# Patient Record
Sex: Female | Born: 1975
Health system: Southern US, Community
[De-identification: ages and names within clinical notes are randomized; demographics above are authoritative.]

## PROBLEM LIST (undated history)

## (undated) DIAGNOSIS — N920 Excessive and frequent menstruation with regular cycle: Secondary | ICD-10-CM

## (undated) DIAGNOSIS — G43909 Migraine, unspecified, not intractable, without status migrainosus: Secondary | ICD-10-CM

## (undated) DIAGNOSIS — D649 Anemia, unspecified: Secondary | ICD-10-CM

## (undated) HISTORY — DX: Migraine, unspecified, not intractable, without status migrainosus: G43.909

## (undated) HISTORY — DX: Excessive and frequent menstruation with regular cycle: N92.0

---

## 1998-09-16 HISTORY — PX: WISDOM TOOTH EXTRACTION: SHX21

## 1999-04-06 ENCOUNTER — Other Ambulatory Visit: Admission: RE | Admit: 1999-04-06 | Discharge: 1999-04-06 | Payer: Self-pay | Admitting: Gynecology

## 1999-12-19 ENCOUNTER — Other Ambulatory Visit: Admission: RE | Admit: 1999-12-19 | Discharge: 1999-12-19 | Payer: Self-pay | Admitting: Gynecology

## 2000-01-30 ENCOUNTER — Encounter (INDEPENDENT_AMBULATORY_CARE_PROVIDER_SITE_OTHER): Payer: Self-pay

## 2000-01-30 ENCOUNTER — Other Ambulatory Visit: Admission: RE | Admit: 2000-01-30 | Discharge: 2000-01-30 | Payer: Self-pay | Admitting: Gynecology

## 2000-06-28 ENCOUNTER — Inpatient Hospital Stay (HOSPITAL_COMMUNITY): Admission: AD | Admit: 2000-06-28 | Discharge: 2000-06-30 | Payer: Self-pay | Admitting: *Deleted

## 2000-08-11 ENCOUNTER — Other Ambulatory Visit: Admission: RE | Admit: 2000-08-11 | Discharge: 2000-08-11 | Payer: Self-pay | Admitting: *Deleted

## 2000-11-17 ENCOUNTER — Other Ambulatory Visit: Admission: RE | Admit: 2000-11-17 | Discharge: 2000-11-17 | Payer: Self-pay | Admitting: Gynecology

## 2000-11-18 ENCOUNTER — Encounter (INDEPENDENT_AMBULATORY_CARE_PROVIDER_SITE_OTHER): Payer: Self-pay | Admitting: Specialist

## 2000-11-18 ENCOUNTER — Other Ambulatory Visit: Admission: RE | Admit: 2000-11-18 | Discharge: 2000-11-18 | Payer: Self-pay | Admitting: Gynecology

## 2001-08-04 ENCOUNTER — Other Ambulatory Visit: Admission: RE | Admit: 2001-08-04 | Discharge: 2001-08-04 | Payer: Self-pay | Admitting: Gynecology

## 2002-02-23 ENCOUNTER — Inpatient Hospital Stay (HOSPITAL_COMMUNITY): Admission: AD | Admit: 2002-02-23 | Discharge: 2002-02-25 | Payer: Self-pay | Admitting: Gynecology

## 2002-04-05 ENCOUNTER — Other Ambulatory Visit: Admission: RE | Admit: 2002-04-05 | Discharge: 2002-04-05 | Payer: Self-pay | Admitting: Gynecology

## 2003-03-13 ENCOUNTER — Encounter: Payer: Self-pay | Admitting: Emergency Medicine

## 2003-03-13 ENCOUNTER — Emergency Department (HOSPITAL_COMMUNITY): Admission: EM | Admit: 2003-03-13 | Discharge: 2003-03-13 | Payer: Self-pay | Admitting: Emergency Medicine

## 2003-03-15 ENCOUNTER — Other Ambulatory Visit: Admission: RE | Admit: 2003-03-15 | Discharge: 2003-03-15 | Payer: Self-pay | Admitting: Gynecology

## 2003-10-21 ENCOUNTER — Emergency Department (HOSPITAL_COMMUNITY): Admission: AD | Admit: 2003-10-21 | Discharge: 2003-10-21 | Payer: Self-pay | Admitting: *Deleted

## 2003-11-20 ENCOUNTER — Emergency Department (HOSPITAL_COMMUNITY): Admission: AD | Admit: 2003-11-20 | Discharge: 2003-11-20 | Payer: Self-pay | Admitting: Family Medicine

## 2004-04-24 ENCOUNTER — Other Ambulatory Visit: Admission: RE | Admit: 2004-04-24 | Discharge: 2004-04-24 | Payer: Self-pay | Admitting: Gynecology

## 2004-11-26 ENCOUNTER — Other Ambulatory Visit: Admission: RE | Admit: 2004-11-26 | Discharge: 2004-11-26 | Payer: Self-pay | Admitting: Gynecology

## 2005-05-13 ENCOUNTER — Other Ambulatory Visit: Admission: RE | Admit: 2005-05-13 | Discharge: 2005-05-13 | Payer: Self-pay | Admitting: Gynecology

## 2006-02-05 ENCOUNTER — Emergency Department (HOSPITAL_COMMUNITY): Admission: EM | Admit: 2006-02-05 | Discharge: 2006-02-05 | Payer: Self-pay | Admitting: Emergency Medicine

## 2006-06-16 ENCOUNTER — Other Ambulatory Visit: Admission: RE | Admit: 2006-06-16 | Discharge: 2006-06-16 | Payer: Self-pay | Admitting: Gynecology

## 2006-09-16 HISTORY — PX: TUBAL LIGATION: SHX77

## 2007-09-07 ENCOUNTER — Encounter (INDEPENDENT_AMBULATORY_CARE_PROVIDER_SITE_OTHER): Payer: Self-pay | Admitting: *Deleted

## 2007-09-07 ENCOUNTER — Inpatient Hospital Stay (HOSPITAL_COMMUNITY): Admission: AD | Admit: 2007-09-07 | Discharge: 2007-09-09 | Payer: Self-pay | Admitting: Obstetrics and Gynecology

## 2007-11-16 ENCOUNTER — Emergency Department (HOSPITAL_COMMUNITY): Admission: EM | Admit: 2007-11-16 | Discharge: 2007-11-16 | Payer: Self-pay | Admitting: Family Medicine

## 2008-12-27 ENCOUNTER — Other Ambulatory Visit: Admission: RE | Admit: 2008-12-27 | Discharge: 2008-12-27 | Payer: Self-pay | Admitting: Gynecology

## 2008-12-27 ENCOUNTER — Ambulatory Visit: Payer: Self-pay | Admitting: Gynecology

## 2008-12-27 ENCOUNTER — Encounter: Payer: Self-pay | Admitting: Gynecology

## 2009-01-13 ENCOUNTER — Ambulatory Visit: Payer: Self-pay | Admitting: Gynecology

## 2009-07-04 ENCOUNTER — Ambulatory Visit: Payer: Self-pay | Admitting: Gynecology

## 2009-07-14 ENCOUNTER — Ambulatory Visit: Payer: Self-pay | Admitting: Gynecology

## 2009-07-28 ENCOUNTER — Ambulatory Visit: Payer: Self-pay | Admitting: Gynecology

## 2009-08-14 ENCOUNTER — Ambulatory Visit: Payer: Self-pay | Admitting: Gynecology

## 2009-11-02 ENCOUNTER — Emergency Department (HOSPITAL_COMMUNITY): Admission: EM | Admit: 2009-11-02 | Discharge: 2009-11-02 | Payer: Self-pay | Admitting: Family Medicine

## 2010-01-31 ENCOUNTER — Ambulatory Visit: Payer: Self-pay | Admitting: Gynecology

## 2010-01-31 ENCOUNTER — Other Ambulatory Visit: Admission: RE | Admit: 2010-01-31 | Discharge: 2010-01-31 | Payer: Self-pay | Admitting: Gynecology

## 2010-04-26 ENCOUNTER — Ambulatory Visit: Payer: Self-pay | Admitting: Gynecology

## 2010-05-14 ENCOUNTER — Ambulatory Visit: Payer: Self-pay | Admitting: Gynecology

## 2010-05-17 HISTORY — PX: ENDOMETRIAL ABLATION: SHX621

## 2010-05-23 ENCOUNTER — Ambulatory Visit: Payer: Self-pay | Admitting: Gynecology

## 2010-06-06 ENCOUNTER — Ambulatory Visit: Payer: Self-pay | Admitting: Gynecology

## 2010-06-07 ENCOUNTER — Ambulatory Visit: Payer: Self-pay | Admitting: Gynecology

## 2010-06-12 ENCOUNTER — Ambulatory Visit: Payer: Self-pay | Admitting: Gynecology

## 2010-06-25 ENCOUNTER — Ambulatory Visit: Payer: Self-pay | Admitting: Gynecology

## 2010-08-03 ENCOUNTER — Ambulatory Visit: Payer: Self-pay | Admitting: Gynecology

## 2010-09-20 ENCOUNTER — Ambulatory Visit
Admission: RE | Admit: 2010-09-20 | Discharge: 2010-09-20 | Payer: Self-pay | Source: Home / Self Care | Attending: Gynecology | Admitting: Gynecology

## 2010-10-02 ENCOUNTER — Ambulatory Visit
Admission: RE | Admit: 2010-10-02 | Discharge: 2010-10-02 | Payer: Self-pay | Source: Home / Self Care | Attending: Gynecology | Admitting: Gynecology

## 2011-01-29 NOTE — Op Note (Signed)
NAMEANGELLI, Latoya Murphy   ACCOUNT NO.:  [id number]   MEDICAL RECORD NO.:  [id number]          PATIENT TYPE:  INP   LOCATION:  9111                          FACILITY:  [facility]   PHYSICIAN:  Gerri Spore B. Earlene Plater, M.D.  DATE OF BIRTH:  1976-04-30   DATE OF PROCEDURE:  09/07/2007  DATE OF DISCHARGE:                               OPERATIVE REPORT   PREOPERATIVE DIAGNOSIS:  Desires tubal sterilization.   POSTOPERATIVE DIAGNOSIS:  Desires tubal sterilization.   PROCEDURE:  Postpartum tubal ligation.   SURGEON:  Chester Holstein. Earlene Plater, M.D.   ASSISTANT:  None.   ANESTHESIA:  Epidural, and 5 mL of 1% Nesacaine local.   SPECIMENS:  Portion of left and right tubes submitted to pathology.   BLOOD LOSS:  Minimal.   COMPLICATIONS:  None.   INDICATIONS:  The patient desires permanent tubal sterilization.  Alternatives such as OCPs, IUD, as well as vasectomy discussed.  Failure  rate, ectopic risks discussed.  Risks of surgery discussed, including  infection, bleeding, and damage to surrounding organs.   PROCEDURE:  The patient was taken to the operating room, with epidural  anesthesia in place.  Prepped and draped in standard fashion.  Foley  catheter was in the bladder, as the patient had some urinary retention  postpartum.  The abdomen was palpated.  The fundus was about 2 cm above  the umbilicus.  Therefore, approximately 2 cm above the umbilicus the  skin was infiltrated with 5 mL of 1% Nesacaine.  A transverse incision  was made.  Fascia divided sharply.  Trendelenburg position was obtained.  Bowel packed superiorly with a moistened tape.  Right tube identified,  followed to its fimbriated end.  A 2 cm segment was isolated just 2 cm  distal to the cornu, doubly tied, and excised.  Hemostasis with Bovie  cautery.  Repeated on the left side in the same manner.  Each tubal site  was reinspected and was hemostatic.  The pack was removed and the fascia  closed with a running stitch of 0  Vicryl.  The skin was closed with 4-0  Vicryl.   The patient tolerated the procedure well, with no complications.  She  was taken to the recovery room awake, alert, and in stable condition.  All counts were correct per the operating room staff.      Gerri Spore B. Earlene Plater, M.D.  Electronically Signed     WBD/MEDQ  D:  09/07/2007  T:  09/08/2007  Job:  [id number]

## 2011-02-01 NOTE — Discharge Summary (Signed)
[facility]  Patient:    Latoya Murphy, Latoya Murphy Visit Number: [id number] MRN: [id number]          Service Type: OBS Location: 910A 9112 01 Attending Physician:  Douglass Rivers Dictated by:   Antony Contras, [facility] Admit Date:  02/23/2002 Discharge Date: 02/25/2002                             Discharge Summary  DISCHARGE DIAGNOSES:          1. Intrauterine pregnancy at term.                               2. Spontaneous onset of labor.  PROCEDURE:                    Normal spontaneous vaginal delivery of a                               viable infant over intact perineum with repair                               of second degree laceration.  HISTORY OF PRESENT ILLNESS:   The patient is a 35 year old gravida 2 para 1-0-0-1, with an LMP of May 12, 2001, [facility] February 16, 2002.  Prenatal course complicated by positive sickle cell trait, also anemia.  PRENATAL LABORATORY DATA:     Blood type O positive, antibody screen negative. RPR, HBsAG, HIV nonreactive.  GBS was positive.  HOSPITAL COURSE:              The patient was admitted on March 03, 2002, with spontaneous onset of labor.  She was given GBS prophylaxis.  She progressed to complete dilatation.  She did sustain some variable decelerations in the 80s after AROM, which were relieved with knee-chest position and oxygen.  Descent did progress rapidly.  She did deliver spontaneously an Apgar 9/9 female infant weighing 7 pounds 7 ounces over an intact perineum with repair of second degree laceration.  Postpartum course:  The patient remained afebrile, had no difficulty voiding, and was ready for discharge in satisfactory condition on her second postpartum day.  CBC:  Hematocrit 28.1, hemoglobin 9.7, WBC 14.9, platelets 207.  DISPOSITION:                  Follow up in six weeks.  Continue prenatal vitamins and iron, Motrin for pain. Dictated by:   Antony Contras, [facility] Attending Physician:  Douglass Rivers DD:  03/22/02 TD:  03/24/02 Job: [id number]

## 2011-02-01 NOTE — Discharge Summary (Signed)
[facility]  Patient:    Latoya Murphy, Latoya Murphy                       MRN: [id number] Adm. Date:  06/28/00 Disc. Date: 06/30/00 Attending:  Katy Fitch, M.D. 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  PROCEDURE:                    Forceps assisted vaginal delivery with second degree midline episiotomy, delivery of viable infant.  HISTORY OF PRESENT ILLNESS:   Patient is a 35 year old prima gravida with LMP of September 21, 1999.  [facility] June 27, 2000.  Prenatal risk factors include positive sickle cell trait, also history of LGSIL CIN 1 Pap which was confirmed with C&B and will be treated postpartum.  LABORATORIES:                 Blood type O+.  Antibody screen negative. Sickle cell positive.  RPR, HBSAG, HIV nonreactive.  Rubella immune.  Patient declined MSAFP.  GBS was negative.  HOSPITAL COURSE:              Patient was admitted on June 28, 2000 at 39 weeks and 6 days with two to three minute contractions x 2 hours.  Cervical dilatation was 8-9 cm, 80%, vertex at a 0 station.  Patient did develop temperature elevation during labor also with some fetal tachycardia but delivery was accomplished with Simpson forceps over a second degree midline episiotomy and patient was delivered of a Apgar 8 and 9 female infant weighing 7 pounds 12 ounces.  Postpartum course was uncomplicated.  Patient remained afebrile.  Had no difficulty voiding.  CBC:  Hematocrit 26.5, hemoglobin 9.2, WBC 15.1, platelets 182.  Patient was able to be discharged on her second postpartum day in satisfactory condition.  DISPOSITION:                  Follow up in six weeks.  Continue prenatal vitamins, iron, Motrin, Tylox for pain.DD:   07/22/00 TD:  07/22/00 Job: [id number]

## 2011-02-06 ENCOUNTER — Other Ambulatory Visit (HOSPITAL_COMMUNITY)
Admission: RE | Admit: 2011-02-06 | Discharge: 2011-02-06 | Disposition: A | Discharge status: Home / Self Care | Payer: 59 | Source: Ambulatory Visit | Attending: Gynecology | Admitting: Gynecology

## 2011-02-06 ENCOUNTER — Encounter (INDEPENDENT_AMBULATORY_CARE_PROVIDER_SITE_OTHER): Payer: 59 | Admitting: Gynecology

## 2011-02-06 ENCOUNTER — Other Ambulatory Visit: Payer: Self-pay | Admitting: Gynecology

## 2011-02-06 DIAGNOSIS — N925 Other specified irregular menstruation: Secondary | ICD-10-CM

## 2011-02-06 DIAGNOSIS — Z01419 Encounter for gynecological examination (general) (routine) without abnormal findings: Secondary | ICD-10-CM

## 2011-02-06 DIAGNOSIS — Z13220 Encounter for screening for lipoid disorders: Secondary | ICD-10-CM

## 2011-02-06 DIAGNOSIS — R635 Abnormal weight gain: Secondary | ICD-10-CM

## 2011-02-06 DIAGNOSIS — Z124 Encounter for screening for malignant neoplasm of cervix: Secondary | ICD-10-CM | POA: Insufficient documentation

## 2011-02-06 DIAGNOSIS — N949 Unspecified condition associated with female genital organs and menstrual cycle: Secondary | ICD-10-CM

## 2011-02-06 DIAGNOSIS — Z833 Family history of diabetes mellitus: Secondary | ICD-10-CM

## 2011-03-08 ENCOUNTER — Institutional Professional Consult (permissible substitution) (INDEPENDENT_AMBULATORY_CARE_PROVIDER_SITE_OTHER): Payer: 59 | Admitting: Gynecology

## 2011-03-08 DIAGNOSIS — N920 Excessive and frequent menstruation with regular cycle: Secondary | ICD-10-CM

## 2011-04-15 ENCOUNTER — Telehealth: Payer: Self-pay | Admitting: *Deleted

## 2011-04-15 NOTE — Telephone Encounter (Signed)
(  SEE CHART NOTE 03/08/11)PT CALLING WANTING TO KNOW HOW MAY DAYS SHE SHOULD TAKE HER LYSTEADA 650MG . PLEASE ADVISE.

## 2011-04-15 NOTE — Telephone Encounter (Signed)
Latoya Murphy, placed outpatient that the Lysteda should be taken only during her menstruation. She should take 2 tablets 3 times a day not to exceed 5 days for a total of 30 tablets during each cycle.

## 2011-04-16 NOTE — Telephone Encounter (Signed)
LM FOR PT TO CALL RE:BELOW NOTE. 

## 2011-04-16 NOTE — Telephone Encounter (Signed)
PT INFORMED WITH THE BELOW NOTE. 

## 2011-04-24 ENCOUNTER — Telehealth: Payer: Self-pay | Admitting: *Deleted

## 2011-04-24 MED ORDER — MEGESTROL ACETATE 40 MG PO TABS: 40.0000 mg | ORAL_TABLET | Freq: Two times a day (BID) | ORAL | Status: AC

## 2011-04-24 NOTE — Telephone Encounter (Signed)
PT INFORMED WITH THE BELOW NOTE. PLEASE PLACE MEGACE RX UNDER MEDS &ORDERS.

## 2011-04-24 NOTE — Telephone Encounter (Signed)
PT C/O BLEEDING LMP:03/14/11 PT HAS TAKEN THE COMPLETE DOSE OF LYSTEDA PRESCRIBED.PT STATES THE BLEEDING IS NOW HEAVY. PT WANTS TO KNOW WHAT SHOULD BE DONE NEXT? PER OFFICE VISIT ON  03/08/11 PT STATES SURGERY WOULD BE NEXT STEP. PLEASE ADVISE.

## 2011-04-24 NOTE — Telephone Encounter (Signed)
The patient with persistent dysfunctional uterine bleeding. Will be recommended that she come to the office for further evaluation and consider other options. She has tried the [place] with no avail. She has also used the Mirena IUD in multiple oral contraceptive pills in the past. She is even tried Depo-Provera as well and has had endometrial ablation in 2011. We'll need to discuss definitive surgery such as a hysterectomy at the office visit. We'll call her in Megace 40 mg to take 1 twice a day for the next 10 days. Of note she's had a tubal sterilization procedure in the past.

## 2011-04-24 NOTE — Telephone Encounter (Signed)
Addended by: Ok Edwards on: 04/24/2011 02:16 PM   Modules accepted: Orders

## 2011-04-24 NOTE — Telephone Encounter (Signed)
PT INFORMED WITH THE BELOW NOTE. 

## 2011-05-01 ENCOUNTER — Telehealth: Payer: Self-pay

## 2011-05-01 ENCOUNTER — Ambulatory Visit (INDEPENDENT_AMBULATORY_CARE_PROVIDER_SITE_OTHER): Payer: 59 | Admitting: Gynecology

## 2011-05-01 ENCOUNTER — Encounter: Payer: Self-pay | Admitting: Gynecology

## 2011-05-01 VITALS — BP 104/70

## 2011-05-01 DIAGNOSIS — N920 Excessive and frequent menstruation with regular cycle: Secondary | ICD-10-CM

## 2011-05-01 DIAGNOSIS — N921 Excessive and frequent menstruation with irregular cycle: Secondary | ICD-10-CM | POA: Insufficient documentation

## 2011-05-01 NOTE — Progress Notes (Signed)
Patient is a 35 year old gravida 3 para 3 who presented to the office today for further discussion of her ongoing menorrhagia. Review of her records indicated due to her dysfunctional uterine bleeding she has used several options. She's had a previous tubal sterilization procedure. She's had a an endometrial ablation done in 2011. She has tried Depo-Provera. She has tried the [place] IUD which has fallen out by itself and not effective. She's tried numerous oral contraceptive pills as well with no success in regulating her cycles. She was seen in the office for her annual exam in May of this year she had a normal TSH normal prolactin normal blood sugar normal screening cholesterol and her CBC and platelet count were normal as well. She has had 3 prior vaginal deliveries. She denies any family history of any bleeding disorder.  Pelvic exam: Bartholin urethra Skene glands: Within normal limits Vagina: No lesions or discharge Cervix: No lesions or discharge Uterus anteverted normal size shape and consistency no palpable adnexal masses or tenderness Rectal exam: Deferred  Assessment: Refractory metromenorrhagia patient like to proceed with definitive treatment I have recommended a transvaginal hysterectomy with ovarian conservation. Patient had a normal ultrasound on September 2011. Literature information was provided on hysterectomy the risks benefits and pros and cons were discussed and we'll schedule for the month of September as per request. All questions are answered we'll follow accordingly. Of note, will check PT PTT and fibrinogen today.

## 2011-05-01 NOTE — Telephone Encounter (Signed)
I left msg for pt to call me to schedule her surgery. Dr. Glenetta Hew indicated she would like week of Sept 24 and I have some block time on Th Sept 27. I asked her to call and let me know how that looks for her and we will schedule.

## 2011-05-02 LAB — PROTIME-INR: Prothrombin Time: 14.7 seconds (ref 11.6–15.2)

## 2011-05-02 LAB — APTT: aPTT: 32 seconds (ref 24–37)

## 2011-05-06 ENCOUNTER — Telehealth: Payer: Self-pay

## 2011-05-06 NOTE — Telephone Encounter (Signed)
I called patient again today and was able to reach her.  We scheduled her surgery for Thursday, Sept 27 7:30am at [facility] as she had requested week of Sept 24th.  She will come to [facility] for labs in the week before surgery and she was instructed.  Per Dr. Glenetta Hew no consult necessary.

## 2011-05-07 ENCOUNTER — Other Ambulatory Visit: Payer: Self-pay | Admitting: Gynecology

## 2011-05-07 DIAGNOSIS — Z01818 Encounter for other preprocedural examination: Secondary | ICD-10-CM

## 2011-05-10 ENCOUNTER — Telehealth: Payer: Self-pay

## 2011-05-10 NOTE — Telephone Encounter (Signed)
I informed patient that I got her FMLA forms but there was not a signed records release form with them.  I offered to fax one but she does not have access to fax.  I will mail it to her and she can sign and send it back.  I put it in the mail this morning.  KA

## 2011-05-16 ENCOUNTER — Telehealth: Payer: Self-pay

## 2011-05-16 NOTE — Telephone Encounter (Addendum)
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  They are starting training classes.  Patient asked Human Resources if it would affect her leave if she took classes while she was out on disability/recovery for surgery. They told her it would not affect her disability but she would need a note from Dr. Glenetta Hew saying okay for her to do. I told patient I thought you would probably want her to wait at least two weeks before she does anything like that. I reminded her there will be a period where she cannot even drive.  I told her I would check with you regarding if you thought classes okay and to advise me if okay how many hours a week she can spend in classes and at what point in her recovery??

## 2011-05-16 NOTE — Telephone Encounter (Signed)
Patient called earlier to see if Dr. Glenetta Hew thought it would be okay for her to take EPIC classes during her recovery period after surgery. Dr. Glenetta Hew said this would be fine beginning two weeks after surgery.  I offered patient a note for employer but at this point she is not sure if she needs one and who it would go to. She will call me back if she needs a letter and I will prepare one for her.

## 2011-06-10 ENCOUNTER — Other Ambulatory Visit (INDEPENDENT_AMBULATORY_CARE_PROVIDER_SITE_OTHER): Payer: 59 | Admitting: *Deleted

## 2011-06-10 DIAGNOSIS — Z01818 Encounter for other preprocedural examination: Secondary | ICD-10-CM

## 2011-06-10 LAB — CULTURE, ROUTINE-ABSCESS

## 2011-06-11 ENCOUNTER — Telehealth: Payer: Self-pay

## 2011-06-11 ENCOUNTER — Other Ambulatory Visit: Payer: 59

## 2011-06-11 ENCOUNTER — Other Ambulatory Visit: Payer: Self-pay | Admitting: Gynecology

## 2011-06-11 DIAGNOSIS — Z01818 Encounter for other preprocedural examination: Secondary | ICD-10-CM

## 2011-06-11 DIAGNOSIS — D500 Iron deficiency anemia secondary to blood loss (chronic): Secondary | ICD-10-CM

## 2011-06-11 NOTE — Telephone Encounter (Signed)
Per Dr. Glenetta Hew I contacted patient around 5:00pm yesterday (Sept 24) and let her know that Dr. Glenetta Hew wanted to cancel her surgery for 9/27 due to her labs being abnormal (hgb, hct and platelets low).  Dr. Glenetta Hew wants patient to have Lupron 11.25 mg and wait a couple of months to have surgery. Pt understands.  Patient concerned about her $38 prepymt she made today and getting it back ASAP. I contacted PH but since after 5:00pm all the batches have been closed and too late to return it today.  PH will make request to the hospital billing depts.

## 2011-06-11 NOTE — Telephone Encounter (Signed)
Patient wanted to clarify the name of injection Dr. Glenetta Hew recommended. I told her it was Lupron and that it would suppress the hormones and keep her endometrium from building up and also, suppress her bleeding and hopefully allow her hct., hcb and platelets to recover. We have rescheduled her surgery for 08/15/11.    Dr. JF-Patient is only taking one over the counter iron tablet daily and has been doing that all along. I told her to go ahead and take twice daily iron and hopefully with the suppression and the extra iron she will be ready for surgery end of Nov. Sound OK?????

## 2011-06-12 NOTE — Telephone Encounter (Signed)
Dr. Lily Peer staff messaged me and said the info I gave the patient was correct.

## 2011-06-13 ENCOUNTER — Other Ambulatory Visit: Payer: Self-pay | Admitting: *Deleted

## 2011-06-13 ENCOUNTER — Telehealth: Payer: Self-pay | Admitting: *Deleted

## 2011-06-13 DIAGNOSIS — N920 Excessive and frequent menstruation with regular cycle: Secondary | ICD-10-CM

## 2011-06-13 MED ORDER — LEUPROLIDE ACETATE (3 MONTH) 11.25 MG IM KIT: 11.2500 mg | PACK | INTRAMUSCULAR | Status: DC

## 2011-06-13 MED ORDER — LEUPROLIDE ACETATE (3 MONTH) 11.25 MG IM KIT
11.2500 mg | PACK | Freq: Once | INTRAMUSCULAR | Status: AC
  Administered 2011-06-14: 11.25 mg via INTRAMUSCULAR

## 2011-06-13 NOTE — Telephone Encounter (Signed)
Message copied by Mckinley Jewel Darlyne Schmiesing L on Thu Jun 13, 2011  3:00 PM ------      Message from: Keenan Bachelor      Created: Mon Jun 10, 2011  5:14 PM       Dr. Glenetta Hew had me cancel her surgery for this coming Thursday because her hgb was 9 hct 20 and platelets low.  He wants her to have LUPRON 11.25 asap and reschedule her surgery in two months. We are planning on rescheduling it at the end of November.  I told her you would check it out and let her know. Thanks!!!

## 2011-06-13 NOTE — Telephone Encounter (Signed)
Patient informed Lupron [place] 11.25 called into [facility].  She will call when injection is ready to be picked up to schedule time to come in for injection.

## 2011-06-14 ENCOUNTER — Ambulatory Visit: Payer: 59 | Admitting: Anesthesiology

## 2011-06-14 DIAGNOSIS — N920 Excessive and frequent menstruation with regular cycle: Secondary | ICD-10-CM

## 2011-06-21 LAB — CBC
HCT: 28.2 — ABNORMAL LOW
Hemoglobin: 11 — ABNORMAL LOW
Hemoglobin: 9.7 — ABNORMAL LOW
Platelets: 200
RDW: 14.6
RDW: 14.9
WBC: 11.2 — ABNORMAL HIGH

## 2011-06-21 LAB — RPR: RPR Ser Ql: NONREACTIVE

## 2011-06-27 ENCOUNTER — Telehealth: Payer: Self-pay

## 2011-06-27 NOTE — Telephone Encounter (Signed)
Previously patient's surgery had to be cancelled right before due to low blood counts. It has been rescheduled to Nov 29th.  Patient knows that you plan to recheck her labs the week before surgery but she asked if in addition to that she might be able to check it one time before. She said she would like to know if things have improved so she can be prepared if not.  She asked if she could have labs checked at end of October just to give her an idea how things are looking.  (If so, what to order?)

## 2011-06-27 NOTE — Telephone Encounter (Signed)
I would recommend patient come to the office in a month for CBC to see if the combination of the Lupron and the R. Supplementation have improved her hemoglobin so we can proceed with her planned transvaginal hysterectomy in the month of November

## 2011-06-27 NOTE — Telephone Encounter (Signed)
Patient advised of Dr. Manuela Schwartz recommendation below. She will come the 2nd week in November. ka

## 2011-07-01 ENCOUNTER — Telehealth: Payer: Self-pay

## 2011-07-01 NOTE — Telephone Encounter (Signed)
Patient called and asked if I would sent a letter stating that her surgery had been rescheduled and giving them the new date to her HR Mgr at [employer] [employer].  She said this will keep [place] from having to re-do the FMLA forms I had done for her Sept surgery that was rescheduled. Letter faxed to HR/attn: Wyman Songster.  ka

## 2011-07-25 ENCOUNTER — Other Ambulatory Visit (INDEPENDENT_AMBULATORY_CARE_PROVIDER_SITE_OTHER): Payer: 59

## 2011-07-25 ENCOUNTER — Other Ambulatory Visit: Payer: 59

## 2011-07-25 DIAGNOSIS — D500 Iron deficiency anemia secondary to blood loss (chronic): Secondary | ICD-10-CM

## 2011-08-01 ENCOUNTER — Other Ambulatory Visit: Payer: 59

## 2011-08-12 ENCOUNTER — Telehealth: Payer: Self-pay

## 2011-08-12 NOTE — Telephone Encounter (Signed)
Dr. Glenetta Hew said it was fine for patient to have preop labs tomorrow.  Pt advised.

## 2011-08-12 NOTE — Telephone Encounter (Signed)
Closed encounter in error. Need to route to MD.

## 2011-08-12 NOTE — Telephone Encounter (Signed)
Patient called to see if she still needed to come this week for preop labwork. (She needs to make her $400 surgery prepayment as well.) I told her that she did need to come in tomorrow and have her preop labwork done as planned. She said that when Victorino Dike called her about her lab results from two weeks ago that she told her that Dr. Glenetta Hew wanted CBC checked on morning of surgery . Is it okay for labs to be done tomorrow or did you want to wait and have CBC morning of surgery (Thursday). Pt. Is planning on coming here in the morning. (The patients that were set up for preop labs in the [facility] system before the change I left instead of confusing them with changing the plan.) Patient's paper chart should be in your surgery box.

## 2011-08-12 NOTE — Telephone Encounter (Signed)
Patient called to see if she still needed to come this week for preop labwork. (She needs to make her $400 surgery prepayment as well.) I told her that she did need to come in tomorrow and have her preop labwork done as planned. She said that when Jennifer called her about her lab results from two weeks ago that she told her that Dr. JF wanted CBC checked on morning of surgery . Is it okay for labs to be done tomorrow or did you want to wait and have CBC morning of surgery (Thursday). Pt. Is planning on coming here in the morning. (The patients that were set up for preop labs in the [facility] system before the change I left instead of confusing them with changing the plan.) Patient's paper chart should be in your surgery box.   

## 2011-08-13 ENCOUNTER — Encounter (HOSPITAL_BASED_OUTPATIENT_CLINIC_OR_DEPARTMENT_OTHER): Payer: Self-pay | Admitting: *Deleted

## 2011-08-13 ENCOUNTER — Other Ambulatory Visit (INDEPENDENT_AMBULATORY_CARE_PROVIDER_SITE_OTHER): Payer: 59

## 2011-08-13 ENCOUNTER — Telehealth: Payer: Self-pay

## 2011-08-13 DIAGNOSIS — Z01818 Encounter for other preprocedural examination: Secondary | ICD-10-CM

## 2011-08-13 NOTE — Telephone Encounter (Signed)
Per Dr. Glenetta Hew he did want to change the medication order and he has already done that in his orders set. I called Denise at [facility] and told her to cancel the Cefotan and to use the new antibiotic order that Dr. Glenetta Hew added to orders set and to call if any questions. ka

## 2011-08-13 NOTE — Telephone Encounter (Signed)
This was a patient that you had done paper orders for and they entered them into computer for you.  You had asked about her operative permit because you could not see it in orders set. It has been put in as it was on the written order sheet inside chart.  Secondly, Angelique Blonder says the patient reports as Penicillin allergy and you have order Cefotan 1 Gm. And that is contraindicated. She wondered if you wanted to change the antibiotic order. 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  Thanks, KA

## 2011-08-13 NOTE — Progress Notes (Addendum)
NPO AFTER MN. PT ARRIVES AT 0615. LABS DONE AT OFFICE, RESULTS IN EPIC. REVIEWED [facility] GUIDELINES.  CALLED LM W/ KATHY AT DR Lily Peer OFFICE, UNABLE TO PUT IN ORDER FOR ANTIBIOTIC IVPT ALLERGY TO PCN, WAITING FOR CALL BACK.

## 2011-08-14 ENCOUNTER — Encounter: Payer: Self-pay | Admitting: Gynecology

## 2011-08-14 ENCOUNTER — Ambulatory Visit (INDEPENDENT_AMBULATORY_CARE_PROVIDER_SITE_OTHER): Payer: 59 | Admitting: Gynecology

## 2011-08-14 VITALS — BP 128/90

## 2011-08-14 DIAGNOSIS — D649 Anemia, unspecified: Secondary | ICD-10-CM | POA: Insufficient documentation

## 2011-08-14 DIAGNOSIS — N920 Excessive and frequent menstruation with regular cycle: Secondary | ICD-10-CM

## 2011-08-14 DIAGNOSIS — N946 Dysmenorrhea, unspecified: Secondary | ICD-10-CM

## 2011-08-14 NOTE — Progress Notes (Signed)
Latoya Murphy 1976/05/14 [id number]   History:    35 y.o.  gravida 3 para 3 who presented to the office today for her preoperative consultation. Patient scheduled for transvaginal hysterectomy Thursday, November 29 as a result of her refractory metromenorrhagia and anemia. Patient had received Lupron 11.25 mg in September. Her surgery previously canceled due to the fact her hemoglobin was 9.6 her most recent hemoglobin on November 27 was 11.4 with hematocrit 37.3 and platelet count 258,000. Patient has had endometrial ablation in the past had tried Depo-Provera had tried oral contraceptive pills and even IUD and her bleeding continues to be an issue. She had normal recent PT PTT and fibrinogen.  Past medical history,surgical history, family history and social history were all reviewed and documented in the EPIC chart.  Gynecologic History Patient's last menstrual period was 06/09/2011. Contraception: tubal ligation Last Pap: 2012. Results were: normal Last mammogram: None. Results were: Not applicable  Obstetric History OB History    Grav Para Term Preterm Abortions TAB SAB Ect Mult Living                   ROS:  Was performed and pertinent positives and negatives are included in the history.  Exam: chaperone present  BP 128/90  LMP 06/09/2011  There is no height or weight on file to calculate BMI.  General appearance : Well developed well nourished female. No acute distress HEENT: Neck supple, trachea midline, no carotid bruits, no thyroidmegaly Lungs: Clear to auscultation, no rhonchi or wheezes, or rib retractions  Heart: Regular rate and rhythm, no murmurs or gallops Breast:Examined in sitting and supine position were symmetrical in appearance, no palpable masses or tenderness,  no skin retraction, no nipple inversion, no nipple discharge, no skin discoloration, no axillary or supraclavicular lymphadenopathy Abdomen: no palpable masses or tenderness, no rebound or  guarding Extremities: no edema or skin discoloration or tenderness  Pelvic:  Bartholin, Urethra, Skene Glands: Within normal limits             Vagina: No gross lesions or discharge  Cervix: No gross lesions or discharge  Uterus  anteverted, normal size, shape and consistency, non-tender and mobile  Adnexa  Without masses or tenderness  Anus and perineum  normal   Rectovaginal  normal sphincter tone without palpated masses or tenderness             Hemoccult not done     Assessment/Plan:  35 y.o. gravida 3 para 3 with refractory menorrhagia contributing to anemia. Patient has failed regulation of her cycles with oral contraceptive pills, Depo-Provera, endometrial ablation, and IUD. Patient's anemia was corrected by postponing her surgery in place her on Lupron 11.25 mg for 3 month course and initiating iron supplementation twice a day. The risks benefits and pros and cons of the operation the been discussed in detail and literature information been provided. Risk of the operation are as follows: The risk of infection (and she'll receive prophylaxis antibiotic), the risk of DVT (she'll have PAS stockings), the risk of hemorrhage (blood or blood products has a risk of anaphylactic reaction hepatitis or AIDS), the risk of trauma to a nearby structures such bladder and or colon. All these issues were discussed with the patient in detail all questions were answered and a follow accordingly.    Ok Edwards MD, 11:04 AM 08/14/2011

## 2011-08-15 ENCOUNTER — Encounter (HOSPITAL_BASED_OUTPATIENT_CLINIC_OR_DEPARTMENT_OTHER): Payer: Self-pay | Admitting: Anesthesiology

## 2011-08-15 ENCOUNTER — Encounter (HOSPITAL_BASED_OUTPATIENT_CLINIC_OR_DEPARTMENT_OTHER): Payer: Self-pay | Admitting: *Deleted

## 2011-08-15 ENCOUNTER — Ambulatory Visit (HOSPITAL_BASED_OUTPATIENT_CLINIC_OR_DEPARTMENT_OTHER)
Admission: RE | Admit: 2011-08-15 | Discharge: 2011-08-16 | Disposition: A | Discharge status: Home / Self Care | Payer: 59 | Source: Ambulatory Visit | Attending: Gynecology | Admitting: Gynecology

## 2011-08-15 ENCOUNTER — Encounter (HOSPITAL_BASED_OUTPATIENT_CLINIC_OR_DEPARTMENT_OTHER)
Admission: RE | Disposition: A | Discharge status: Home / Self Care | Payer: Self-pay | Source: Ambulatory Visit | Attending: Gynecology

## 2011-08-15 ENCOUNTER — Ambulatory Visit (HOSPITAL_BASED_OUTPATIENT_CLINIC_OR_DEPARTMENT_OTHER): Payer: 59 | Admitting: Anesthesiology

## 2011-08-15 ENCOUNTER — Other Ambulatory Visit: Payer: Self-pay | Admitting: Gynecology

## 2011-08-15 DIAGNOSIS — D649 Anemia, unspecified: Secondary | ICD-10-CM

## 2011-08-15 DIAGNOSIS — Z9889 Other specified postprocedural states: Secondary | ICD-10-CM

## 2011-08-15 DIAGNOSIS — N920 Excessive and frequent menstruation with regular cycle: Secondary | ICD-10-CM | POA: Insufficient documentation

## 2011-08-15 DIAGNOSIS — N946 Dysmenorrhea, unspecified: Secondary | ICD-10-CM

## 2011-08-15 HISTORY — PX: VAGINAL HYSTERECTOMY: SHX2639

## 2011-08-15 HISTORY — DX: Anemia, unspecified: D64.9

## 2011-08-15 SURGERY — HYSTERECTOMY, VAGINAL
Anesthesia: General | Site: Vagina | Wound class: Clean Contaminated

## 2011-08-15 MED ORDER — FENTANYL CITRATE 0.05 MG/ML IJ SOLN
25.0000 ug | INTRAMUSCULAR | Status: DC | PRN
  Administered 2011-08-15 (×2): 25 ug via INTRAVENOUS

## 2011-08-15 MED ORDER — CLINDAMYCIN PHOSPHATE 900 MG/50ML IV SOLN: 900.0000 mg | INTRAVENOUS | Status: AC

## 2011-08-15 MED ORDER — LACTATED RINGERS IV SOLN: INTRAVENOUS | Status: DC

## 2011-08-15 MED ORDER — SODIUM CHLORIDE 0.9 % IR SOLN
Status: DC | PRN
  Administered 2011-08-15: 500 mL

## 2011-08-15 MED ORDER — LACTATED RINGERS IV SOLN
INTRAVENOUS | Status: DC
  Administered 2011-08-15: 14:00:00 via INTRAVENOUS

## 2011-08-15 MED ORDER — CIPROFLOXACIN IN D5W 400 MG/200ML IV SOLN
INTRAVENOUS | Status: DC | PRN
  Administered 2011-08-15: 400 mg via INTRAVENOUS

## 2011-08-15 MED ORDER — DIPHENHYDRAMINE HCL 12.5 MG/5ML PO ELIX: 12.5000 mg | ORAL_SOLUTION | Freq: Four times a day (QID) | ORAL | Status: DC | PRN

## 2011-08-15 MED ORDER — PROPOFOL 10 MG/ML IV EMUL
INTRAVENOUS | Status: DC | PRN
  Administered 2011-08-15: 200 mg via INTRAVENOUS

## 2011-08-15 MED ORDER — SODIUM CHLORIDE 0.9 % IJ SOLN: 9.0000 mL | INTRAMUSCULAR | Status: DC | PRN

## 2011-08-15 MED ORDER — LACTATED RINGERS IV SOLN
INTRAVENOUS | Status: DC | PRN
  Administered 2011-08-15 (×2): via INTRAVENOUS

## 2011-08-15 MED ORDER — ONDANSETRON HCL 4 MG/2ML IJ SOLN: 4.0000 mg | Freq: Four times a day (QID) | INTRAMUSCULAR | Status: DC | PRN

## 2011-08-15 MED ORDER — SILVER NITRATE-POT NITRATE 75-25 % EX MISC
CUTANEOUS | Status: DC | PRN
  Administered 2011-08-15: 1

## 2011-08-15 MED ORDER — MIDAZOLAM HCL 5 MG/5ML IJ SOLN
INTRAMUSCULAR | Status: DC | PRN
  Administered 2011-08-15: 2 mg via INTRAVENOUS

## 2011-08-15 MED ORDER — NALOXONE HCL 0.4 MG/ML IJ SOLN: 0.4000 mg | INTRAMUSCULAR | Status: DC | PRN

## 2011-08-15 MED ORDER — PROMETHAZINE HCL 25 MG/ML IJ SOLN
6.2500 mg | INTRAMUSCULAR | Status: DC | PRN
  Administered 2011-08-15: 12.5 mg via INTRAVENOUS

## 2011-08-15 MED ORDER — LIDOCAINE HCL (CARDIAC) 20 MG/ML IV SOLN
INTRAVENOUS | Status: DC | PRN
  Administered 2011-08-15: 100 mg via INTRAVENOUS

## 2011-08-15 MED ORDER — DIPHENHYDRAMINE HCL 50 MG/ML IJ SOLN: 12.5000 mg | Freq: Four times a day (QID) | INTRAMUSCULAR | Status: DC | PRN

## 2011-08-15 MED ORDER — DIPHENHYDRAMINE HCL 50 MG/ML IJ SOLN
12.5000 mg | Freq: Four times a day (QID) | INTRAMUSCULAR | Status: DC | PRN
  Administered 2011-08-15: 12.5 mg via INTRAVENOUS

## 2011-08-15 MED ORDER — LIDOCAINE-EPINEPHRINE 1 %-1:100000 IJ SOLN
INTRAMUSCULAR | Status: DC | PRN
  Administered 2011-08-15: 20 mL

## 2011-08-15 MED ORDER — FENTANYL CITRATE 0.05 MG/ML IJ SOLN
INTRAMUSCULAR | Status: DC | PRN
  Administered 2011-08-15 (×4): 50 ug via INTRAVENOUS

## 2011-08-15 MED ORDER — CLINDAMYCIN PHOSPHATE 600 MG/50ML IV SOLN
INTRAVENOUS | Status: DC | PRN
  Administered 2011-08-15: 900 mg via INTRAVENOUS

## 2011-08-15 MED ORDER — KETOROLAC TROMETHAMINE 30 MG/ML IJ SOLN
INTRAMUSCULAR | Status: DC | PRN
  Administered 2011-08-15: 30 mg via INTRAVENOUS

## 2011-08-15 MED ORDER — DEXAMETHASONE SODIUM PHOSPHATE 4 MG/ML IJ SOLN
INTRAMUSCULAR | Status: DC | PRN
  Administered 2011-08-15: 10 mg via INTRAVENOUS

## 2011-08-15 MED ORDER — ESTRADIOL 0.1 MG/GM VA CREA
TOPICAL_CREAM | VAGINAL | Status: DC | PRN
  Administered 2011-08-15: 1 via VAGINAL

## 2011-08-15 MED ORDER — CIPROFLOXACIN IN D5W 400 MG/200ML IV SOLN: 400.0000 mg | INTRAVENOUS | Status: AC

## 2011-08-15 MED ORDER — FENTANYL 10 MCG/ML IV SOLN: INTRAVENOUS | Status: DC

## 2011-08-15 MED ORDER — DIPHENHYDRAMINE HCL 50 MG PO CAPS: 50.0000 mg | ORAL_CAPSULE | Freq: Every evening | ORAL | Status: DC | PRN

## 2011-08-15 MED ORDER — MORPHINE SULFATE (PF) 1 MG/ML IV SOLN
INTRAVENOUS | Status: DC
  Administered 2011-08-15: 1 mL via INTRAVENOUS
  Administered 2011-08-15: 9.6 mL via INTRAVENOUS
  Administered 2011-08-15: 10:00:00 via INTRAVENOUS

## 2011-08-15 MED ORDER — TRAMADOL HCL 50 MG PO TABS
100.0000 mg | ORAL_TABLET | Freq: Four times a day (QID) | ORAL | Status: DC
  Administered 2011-08-15 (×2): 100 mg via ORAL

## 2011-08-15 SURGICAL SUPPLY — 47 items
BAG URINE DRAINAGE (UROLOGICAL SUPPLIES) ×2 IMPLANT
BLADE SURG 10 STRL SS (BLADE) ×4 IMPLANT
CANISTER SUCTION 2500CC (MISCELLANEOUS) ×2 IMPLANT
CATH FOLEY 2WAY SLVR  5CC 16FR (CATHETERS) ×1
CATH FOLEY 2WAY SLVR 5CC 16FR (CATHETERS) ×1 IMPLANT
CLOTH BEACON ORANGE TIMEOUT ST (SAFETY) ×2 IMPLANT
COVER MAYO STAND STRL (DRAPES) ×2 IMPLANT
COVER TABLE BACK 60X90 (DRAPES) ×2 IMPLANT
DRAPE LG THREE QUARTER DISP (DRAPES) ×4 IMPLANT
DRAPE UNDERBUTTOCKS STRL (DRAPE) ×2 IMPLANT
ELECT BLADE 6.5 .24CM SHAFT (ELECTRODE) ×2 IMPLANT
GAUZE VAGINAL PACKING 31 073 (GAUZE/BANDAGES/DRESSINGS) ×2 IMPLANT
GLOVE BIO SURGEON STRL SZ7.5 (GLOVE) IMPLANT
GLOVE BIOGEL PI IND STRL 7.0 (GLOVE) ×1 IMPLANT
GLOVE BIOGEL PI INDICATOR 7.0 (GLOVE) ×1
GLOVE ECLIPSE 7.0 STRL STRAW (GLOVE) ×2 IMPLANT
GLOVE ECLIPSE 7.5 STRL STRAW (GLOVE) ×4 IMPLANT
GLOVE INDICATOR 6.5 STRL GRN (GLOVE) ×4 IMPLANT
GLOVE INDICATOR 8.0 STRL GRN (GLOVE) ×2 IMPLANT
GOWN BRE IMP SLV AUR LG STRL (GOWN DISPOSABLE) ×4 IMPLANT
GOWN BRE IMP SLV AUR XL STRL (GOWN DISPOSABLE) ×4 IMPLANT
GOWN W/COTTON TOWEL STD LRG (GOWNS) ×2 IMPLANT
HOLDER FOLEY CATH W/STRAP (MISCELLANEOUS) ×2 IMPLANT
LEGGING LITHOTOMY PAIR STRL (DRAPES) ×2 IMPLANT
NEEDLE SPNL 22GX3.5 QUINCKE BK (NEEDLE) ×2 IMPLANT
NS IRRIG 500ML POUR BTL (IV SOLUTION) ×2 IMPLANT
PACK BASIN DAY SURGERY FS (CUSTOM PROCEDURE TRAY) ×2 IMPLANT
PACKING VAGINAL (PACKING) ×2 IMPLANT
PAD OB MATERNITY 4.3X12.25 (PERSONAL CARE ITEMS) ×2 IMPLANT
PAD PREP 24X48 CUFFED NSTRL (MISCELLANEOUS) ×2 IMPLANT
PENCIL BUTTON HOLSTER BLD 10FT (ELECTRODE) ×2 IMPLANT
SPONGE LAP 4X18 X RAY DECT (DISPOSABLE) ×2 IMPLANT
SUT VIC AB 0 CT1 18XCR BRD 8 (SUTURE) ×2 IMPLANT
SUT VIC AB 0 CT1 36 (SUTURE) ×2 IMPLANT
SUT VIC AB 0 CT1 8-18 (SUTURE) ×2
SUT VIC AB 2-0 SH 27 (SUTURE) ×1
SUT VIC AB 2-0 SH 27XBRD (SUTURE) ×1 IMPLANT
SUT VIC AB 2-0 UR5 27 (SUTURE) ×2 IMPLANT
SUT VICRYL 0 TIES 12 18 (SUTURE) ×2 IMPLANT
SYR BULB IRRIGATION 50ML (SYRINGE) ×2 IMPLANT
SYR CONTROL 10ML LL (SYRINGE) ×2 IMPLANT
SYRINGE 10CC LL (SYRINGE) ×2 IMPLANT
TOWEL OR 17X24 6PK STRL BLUE (TOWEL DISPOSABLE) ×4 IMPLANT
TRAY DSU PREP LF (CUSTOM PROCEDURE TRAY) ×2 IMPLANT
TUBE CONNECTING 12X1/4 (SUCTIONS) ×2 IMPLANT
WATER STERILE IRR 500ML POUR (IV SOLUTION) ×2 IMPLANT
YANKAUER SUCT BULB TIP NO VENT (SUCTIONS) ×2 IMPLANT

## 2011-08-15 NOTE — Op Note (Signed)
08/15/2011  9:26 AM  PATIENT:  Latoya Murphy  35 y.o. female  PRE-OPERATIVE DIAGNOSIS:  MENORRHAGIA  POST-OPERATIVE DIAGNOSIS:  MENORRHAGIA  PROCEDURE:  Procedure(s): HYSTERECTOMY VAGINAL  SURGEON:  Surgeon(s): Ok Edwards, MD Trellis Paganini, MD  ANESTHESIA:   general  FINDINGS: Normal-size uterus and cervix and normal-appearing ovaries  DESCRIPTION OF OPERATION: The patient was taken to the operating room and underwent general endotracheal anesthesia. Patient had PAS stockings for DVT prophylaxis and had received antibiotics consisting of clindamycin 900 mg and Cipro 400 mg because of allergies to penicillin. Patient's vagina and perineum were prepped and draped in usual sterile fashion a Foley catheter had been inserted to monitor urinary output. Patient was placed in the high lithotomy position. Examination under anesthesia demonstrated an anteverted uterus normal size shape and consistency with no palpable adnexal masses. A short weighted billed speculum was placed in the posterior vaginal vault and retractors were used for exposure. 2 Lahey thyroid clamps were utilized to grasp the anterior and posterior cervical lip. 1% lidocaine with 1 100,000 dilution with epinephrine was utilized for a total of 15 cc. The lidocaine was applied in the cervical vaginal fold in a circumferential manner. The scalpel was then utilized to incise the cervical vaginal fold in a circumferential manner. A posterior colpotomy was established the short weighted billed speculum was changed to the long billed weighted speculum. The right and left uterosacral ligaments were clamped cut and suture ligated with 0 Vicryl suture in a transfixation stitch manner the broad and cardinal ligaments on each side were clamped cut and suture ligated with 0 Vicryl suture as well. The anterior colpotomy was established after meticulously separating the bladder from the lower uterine segment and the peritoneal  cavity was entered cautiously. The right and left triple pedicles were clamped cut and suture ligated with 0 Vicryl suture first with a free tie 0 Vicryl suture followed by the transfixation stitch. The uterus and cervix was passed off the operative field. The vagina was copiously irrigated with normal saline solution the posterior vaginal H. of the mucosa and peritoneum were secured from 3:00 to 9:00 with a running locking stitch of 0 Vicryl suture. Of note both ovaries appeared to be normal in appearance. A small hematoma was noted anterior peritoneum which was compressed and no active bleeding was noted and the rest of the vaginal cuff was then closed with interrupted sutures of 0 Vicryl suture. The vagina once again copious irrigated with normal saline solution and the vagina was packed with a Kerlix gauze impregnated with Estrace vaginal cream. Patient was extubated and transferred to recovery room stable vital signs.  ESTIMATED BLOOD LOSS: Less than 50 cc  Intake/Output Summary (Last 24 hours) at 08/15/11 0926 Last data filed at 08/15/11 0915  Gross per 24 hour  Intake   1200 ml  Output    280 ml  Net    920 ml     BLOOD ADMINISTERED:none   LOCAL MEDICATIONS USED:  LIDOCAINE  With epi  1:100,000  total  15CC  SPECIMEN:  Source of Specimen:  Uterus and cervix  DISPOSITION OF SPECIMEN:  PATHOLOGY  COUNTS:  YES  PLAN OF CARE: Transfer to PACU  [facility] HMD9:26 AMTD@

## 2011-08-15 NOTE — Progress Notes (Signed)
Report given to on coming nurse P. Onalee Hua RN

## 2011-08-15 NOTE — Progress Notes (Signed)
Patient verbalized relief from phenergan. Ambulated in hall way: length of hall way and back.Tolerated activity well.

## 2011-08-15 NOTE — Anesthesia Procedure Notes (Signed)
Procedure Name: LMA Insertion Date/Time: 08/15/2011 7:39 AM Performed by: Huel Coventry Pre-anesthesia Checklist: Patient identified, Emergency Drugs available, Suction available and Patient being monitored Patient Re-evaluated:Patient Re-evaluated prior to inductionOxygen Delivery Method: Circle System Utilized Preoxygenation: Pre-oxygenation with 100% oxygen Intubation Type: IV induction Ventilation: Mask ventilation without difficulty LMA: LMA inserted LMA Size: 4.0 Number of attempts: 1 Airway Equipment and Method: bite block Placement Confirmation: positive ETCO2 Tube secured with: Tape Dental Injury: Teeth and Oropharynx as per pre-operative assessment

## 2011-08-15 NOTE — Progress Notes (Signed)
Patient resting quietly tolerating po's well  Foley patent draining yellow urine. Patient  Repositioned for comfort.

## 2011-08-15 NOTE — Anesthesia Postprocedure Evaluation (Signed)
  Anesthesia Post-op Note  Patient: Latoya Murphy  Procedure(s) Performed:  HYSTERECTOMY VAGINAL - TOTAL VAGINAL HYSTERECTOMY  Patient Location: PACU  Anesthesia Type: General  Level of Consciousness: awake and alert   Airway and Oxygen Therapy: Patient Spontanous Breathing  Post-op Pain: mild  Post-op Assessment: Post-op Vital signs reviewed, Patient's Cardiovascular Status Stable, Respiratory Function Stable, Patent Airway and No signs of Nausea or vomiting  Post-op Vital Signs: stable  Complications: No apparent anesthesia complications

## 2011-08-15 NOTE — Interval H&P Note (Signed)
History and Physical Interval Note:  08/15/2011 7:05 AM  Latoya Murphy  has presented today for surgery, with the diagnosis of MENORRHAGIA  The various methods of treatment have been discussed with the patient and family. After consideration of risks, benefits and other options for treatment, the patient has consented to  Procedure(s): HYSTERECTOMY VAGINAL as a surgical intervention .  The patients' history has been reviewed, patient examined, no change in status, stable for surgery.  I have reviewed the patients' chart and labs.  Questions were answered to the patient's satisfaction.     Ok Edwards

## 2011-08-15 NOTE — Progress Notes (Signed)
Patient admitted to [facility] room# 2 from PACU. Report  Received from nurse D.Fortino Sic. Patient alert and oriented [facility] guidelines reviewed with patient and family members. Foley patent draining yellow urine. Per ipad in place no bleeding noted.

## 2011-08-15 NOTE — Anesthesia Preprocedure Evaluation (Addendum)
Anesthesia Evaluation  Patient identified by MRN, date of birth, ID band Patient awake    Reviewed: Allergy & Precautions, H&P , NPO status , Patient's Chart, lab work & pertinent test results  Airway Mallampati: II TM Distance: >3 FB Neck ROM: full    Dental No notable dental hx. (+) Teeth Intact and Dental Advisory Given   Pulmonary neg pulmonary ROS,  clear to auscultation  Pulmonary exam normal       Cardiovascular Exercise Tolerance: Good neg cardio ROS regular Normal    Neuro/Psych Negative Neurological ROS  Negative Psych ROS   GI/Hepatic negative GI ROS, Neg liver ROS,   Endo/Other  Negative Endocrine ROS  Renal/GU negative Renal ROS  Genitourinary negative   Musculoskeletal   Abdominal   Peds  Hematology negative hematology ROS (+)   Anesthesia Other Findings   Reproductive/Obstetrics negative OB ROS                          Anesthesia Physical Anesthesia Plan  ASA: I  Anesthesia Plan: General   Post-op Pain Management:    Induction: Intravenous  Airway Management Planned: LMA  Additional Equipment:   Intra-op Plan:   Post-operative Plan:   Informed Consent: I have reviewed the patients History and Physical, chart, labs and discussed the procedure including the risks, benefits and alternatives for the proposed anesthesia with the patient or authorized representative who has indicated his/her understanding and acceptance.   Dental Advisory Given  Plan Discussed with: CRNA  Anesthesia Plan Comments:         Anesthesia Quick Evaluation  

## 2011-08-15 NOTE — Progress Notes (Signed)
Patient status post transvaginal hysterectomy this morning secondary to refractory menorrhagia. Patient is doing well tolerated her diet this evening has been up and ambulating urine output has been greater than 100 cc per hour and clear. Vital signs stable patient afebrile vaginal packing removed. Patient been complaining of some pruritus from the morphine PCA pump. The morphine PCA pump will be discontinued she'll be placed on Ultram 100 mg every 6 hours when necessary. She will also be placed on Benadryl 50 mg by mouth every 6 hours when necessary. We'll plan discontinue Foley catheter this evening. We'll check CBC in the morning and plan on discharge home later tomorrow morning.

## 2011-08-15 NOTE — Progress Notes (Signed)
Patient requested to sit on side of bed to eat her soup ate 50% of soup c/o nausea no active vomiting this time 12.5mg  of phenergan iv administered. Will continue to monitor.

## 2011-08-15 NOTE — Transfer of Care (Signed)
Immediate Anesthesia Transfer of Care Note  Patient: Latoya Murphy  Procedure(s) Performed:  HYSTERECTOMY VAGINAL - TOTAL VAGINAL HYSTERECTOMY  Patient Location: PACU  Anesthesia Type: General  Level of Consciousness: sleepy, awakens to name, follows commands.  Airway & Oxygen Therapy: Patient Spontanous Breathing and Patient connected to face mask oxygen  Post-op Assessment: Report given to PACU RN and Post -op Vital signs reviewed and stable  Post vital signs: Reviewed and stable  Complications: No apparent anesthesia complications

## 2011-08-15 NOTE — Progress Notes (Signed)
MD in to see patient Prn tramadol administered as ordered. PCA morphine D/C'd. Pt resting quietly.

## 2011-08-15 NOTE — H&P (View-Only) (Signed)
Latoya Murphy 12/10/1975 [id number]   History:    35 y.o.  gravida 3 para 3 who presented to the office today for her preoperative consultation. Patient scheduled for transvaginal hysterectomy Thursday, November 29 as a result of her refractory metromenorrhagia and anemia. Patient had received Lupron 11.25 mg in September. Her surgery previously canceled due to the fact her hemoglobin was 9.6 her most recent hemoglobin on November 27 was 11.4 with hematocrit 37.3 and platelet count 258,000. Patient has had endometrial ablation in the past had tried Depo-Provera had tried oral contraceptive pills and even IUD and her bleeding continues to be an issue. She had normal recent PT PTT and fibrinogen.  Past medical history,surgical history, family history and social history were all reviewed and documented in the EPIC chart.  Gynecologic History Patient's last menstrual period was 06/09/2011. Contraception: tubal ligation Last Pap: 2012. Results were: normal Last mammogram: None. Results were: Not applicable  Obstetric History OB History    Grav Para Term Preterm Abortions TAB SAB Ect Mult Living                   ROS:  Was performed and pertinent positives and negatives are included in the history.  Exam: chaperone present  BP 128/90  LMP 06/09/2011  There is no height or weight on file to calculate BMI.  General appearance : Well developed well nourished female. No acute distress HEENT: Neck supple, trachea midline, no carotid bruits, no thyroidmegaly Lungs: Clear to auscultation, no rhonchi or wheezes, or rib retractions  Heart: Regular rate and rhythm, no murmurs or gallops Breast:Examined in sitting and supine position were symmetrical in appearance, no palpable masses or tenderness,  no skin retraction, no nipple inversion, no nipple discharge, no skin discoloration, no axillary or supraclavicular lymphadenopathy Abdomen: no palpable masses or tenderness, no rebound or  guarding Extremities: no edema or skin discoloration or tenderness  Pelvic:  Bartholin, Urethra, Skene Glands: Within normal limits             Vagina: No gross lesions or discharge  Cervix: No gross lesions or discharge  Uterus  anteverted, normal size, shape and consistency, non-tender and mobile  Adnexa  Without masses or tenderness  Anus and perineum  normal   Rectovaginal  normal sphincter tone without palpated masses or tenderness             Hemoccult not done     Assessment/Plan:  35 y.o. gravida 3 para 3 with refractory menorrhagia contributing to anemia. Patient has failed regulation of her cycles with oral contraceptive pills, Depo-Provera, endometrial ablation, and IUD. Patient's anemia was corrected by postponing her surgery in place her on Lupron 11.25 mg for 3 month course and initiating iron supplementation twice a day. The risks benefits and pros and cons of the operation the been discussed in detail and literature information been provided. Risk of the operation are as follows: The risk of infection (and she'll receive prophylaxis antibiotic), the risk of DVT (she'll have PAS stockings), the risk of hemorrhage (blood or blood products has a risk of anaphylactic reaction hepatitis or AIDS), the risk of trauma to a nearby structures such bladder and or colon. All these issues were discussed with the patient in detail all questions were answered and a follow accordingly.    Joeph Szatkowski H MD, 11:04 AM 08/14/2011     

## 2011-08-15 NOTE — Progress Notes (Signed)
Reported to D. Redgie Grayer, RN as caregiver

## 2011-08-16 ENCOUNTER — Encounter (HOSPITAL_BASED_OUTPATIENT_CLINIC_OR_DEPARTMENT_OTHER): Payer: Self-pay | Admitting: Gynecology

## 2011-08-16 LAB — CBC
MCHC: 34.4 g/dL (ref 30.0–36.0)
Platelets: 203 10*3/uL (ref 150–400)
RDW: 15.6 % — ABNORMAL HIGH (ref 11.5–15.5)
WBC: 9 10*3/uL (ref 4.0–10.5)

## 2011-08-16 MED ORDER — TRAMADOL HCL 50 MG PO TABS: 100.0000 mg | ORAL_TABLET | Freq: Four times a day (QID) | ORAL | Status: DC

## 2011-08-16 MED ORDER — METOCLOPRAMIDE HCL 10 MG PO TABS: 10.0000 mg | ORAL_TABLET | Freq: Three times a day (TID) | ORAL | Status: DC

## 2011-08-16 MED ORDER — METOCLOPRAMIDE HCL 10 MG PO TABS: 10.0000 mg | ORAL_TABLET | Freq: Three times a day (TID) | ORAL | Status: AC

## 2011-08-16 MED ORDER — TRAMADOL HCL ER 100 MG PO TB24: 100.0000 mg | ORAL_TABLET | Freq: Four times a day (QID) | ORAL | Status: AC | PRN

## 2011-08-16 NOTE — Discharge Summary (Signed)
Physician Discharge Summary  Patient ID: Latoya Murphy MRN: [id number] DOB/AGE: Nov 25, 1975 35 y.o.  Admit date: 08/15/2011 Discharge date: 08/16/2011  Admission Diagnoses: [facility] MENORRAGIA   Discharge Diagnoses:  Active Problems:  * No active hospital problems. *    Discharged Condition: good  Consults:none  Significant Diagnostic Studies: None  Treatments:surgery: Transvaginal hysterectomy  Filed Vitals:   08/16/11 0613  BP: 108/74  Pulse: 79  Temp: 97.2 F (36.2 C)  Resp: 16         Hospital Course: Patient was admitted to the hospital the morning of November 30 where she underwent a transvaginal hysterectomy with ovarian conservation as a result of her refractory menorrhagia dysmenorrhea and anemia. Patient had a 100 cc blood loss from her surgery. Patient had an uneventful postoperative course she was kept in hospital 23 hours of observation. Her diet was gradually increased from clear liquids to full regular diet this morning. Her Foley catheter was discontinued early in the morning and has voided spontaneously this morning 200 cc. Her postop hemoglobin and hematocrit were 10.3 and 29.9 respectively. On examination her abdomen was soft nontender no rebound guarding positive bowel sounds in all 4 quadrants no vaginal bleeding and her lungs were clear to auscultation no rhonchi or wheezes no CVA tenderness, lower extremities with no edema no cords or tenderness. Patient was up and ambulating and passing flatus this morning was rated be discharged home.   Significant Diagnostic Studies: None  Treatments: surgery: Transvaginal hysterectomy  Disposition:  patient was rated be discharged home this morning she will be given a prescription for Ultram 50 mg to take one by mouth every 6 hours when necessary. Reglan 10 mg 1 by mouth every 6 hours when necessary nausea or vomiting. Patient will be seen the office in 2 weeks for her postop appointment.  Discharge  Orders    Future Orders Please Complete By Expires   Catheter care          Current Discharge Medication List    STOP taking these medications     FERROUS SULFATE PO      ibuprofen (ADVIL,MOTRIN) 200 MG tablet      SUMAtriptan (IMITREX) 50 MG tablet             Signed: Ok Edwards 08/16/2011, 8:26 AM

## 2011-08-16 NOTE — Progress Notes (Signed)
D/C per w/c with husband-instructions with patient,pt and husband verbalize understand home care instructions.

## 2011-08-30 ENCOUNTER — Ambulatory Visit (INDEPENDENT_AMBULATORY_CARE_PROVIDER_SITE_OTHER): Payer: 59 | Admitting: Gynecology

## 2011-08-30 ENCOUNTER — Encounter: Payer: Self-pay | Admitting: Gynecology

## 2011-08-30 VITALS — BP 110/70

## 2011-08-30 DIAGNOSIS — Z9889 Other specified postprocedural states: Secondary | ICD-10-CM

## 2011-08-30 DIAGNOSIS — Z23 Encounter for immunization: Secondary | ICD-10-CM

## 2011-08-30 MED ORDER — CLINDAMYCIN PHOSPHATE 2 % VA CREA: 1.0000 | TOPICAL_CREAM | Freq: Every day | VAGINAL | Status: AC

## 2011-08-30 NOTE — Progress Notes (Signed)
Patient presented to the office today for her first postop visit she is status post transvaginal hysterectomy with ovarian conservation secondary to menorrhagia. Patient has had some light pinkish discharge on and off but otherwise doing well. 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 At time of discharge from the hospital her hemoglobin was 10.3 her final pathology report is as follows:  Cervix with no dysplasia atypia or malignancy Proliferative endometrium Benign myometrium Benign paratubal cyst  Exam: Abdomen: Soft nontender no rebound or guarding Pelvic Bartholin urethra Skene glands: Within normal limits vagina: No gross lesions on inspection, vaginal cuff slight oozing in 2 sites at the vaginal cuff which were contained with silver nitrate Bimanual exam no vaginal cuff masses or tenderness  Assessment/plan: 2 weeks status post transvaginal hysterectomy for menorrhagia. Patient with anemia currently on iron supplementation. She'll return to the office in 4 weeks for final postop visit and we'll check her CBC at that point. She was given a prescription for Cleocin vaginal cream to apply intravaginally each bedtime for 5 days. She'll refrain from any strenuous activity such as lifting or sexual intercourse until the final postop visit in 4 weeks.

## 2011-08-30 NOTE — Patient Instructions (Signed)
Apply vaginal cream before bedtime for 5 days. Don't put the entire applicator tube inside vagina just half way only.

## 2011-09-02 ENCOUNTER — Other Ambulatory Visit: Payer: Self-pay | Admitting: *Deleted

## 2011-09-02 MED ORDER — SUMATRIPTAN SUCCINATE 50 MG PO TABS: 50.0000 mg | ORAL_TABLET | ORAL | Status: DC | PRN

## 2011-09-26 ENCOUNTER — Ambulatory Visit (INDEPENDENT_AMBULATORY_CARE_PROVIDER_SITE_OTHER): Payer: 59 | Admitting: Gynecology

## 2011-09-26 ENCOUNTER — Encounter: Payer: Self-pay | Admitting: Gynecology

## 2011-09-26 VITALS — BP 126/70

## 2011-09-26 DIAGNOSIS — K5900 Constipation, unspecified: Secondary | ICD-10-CM

## 2011-09-26 DIAGNOSIS — Z9889 Other specified postprocedural states: Secondary | ICD-10-CM

## 2011-09-26 DIAGNOSIS — D649 Anemia, unspecified: Secondary | ICD-10-CM

## 2011-09-26 LAB — CBC WITH DIFFERENTIAL/PLATELET
Eosinophils Relative: 2 % (ref 0–5)
HCT: 34.4 % — ABNORMAL LOW (ref 36.0–46.0)
Lymphocytes Relative: 42 % (ref 12–46)
Lymphs Abs: 2.3 10*3/uL (ref 0.7–4.0)
MCV: 77 fL — ABNORMAL LOW (ref 78.0–100.0)
Monocytes Absolute: 0.4 10*3/uL (ref 0.1–1.0)
RBC: 4.47 MIL/uL (ref 3.87–5.11)
WBC: 5.3 10*3/uL (ref 4.0–10.5)

## 2011-09-26 MED ORDER — POLYETHYLENE GLYCOL 3350 17 GM/SCOOP PO POWD: 17.0000 g | Freq: Every day | ORAL | Status: AC

## 2011-09-26 NOTE — Patient Instructions (Signed)
Take tablespoon of Miralx daily. Increase fruit and fiber intake and drink lots of fluid. Please make appointment to see GI Dr. Loreta Ave.  Constipation in Adults Constipation is having fewer than 2 bowel movements per week. Usually, the stools are hard. As we grow older, constipation is more common. If you try to fix constipation with laxatives, the problem may get worse. This is because laxatives taken over a long period of time make the colon muscles weaker. A low-fiber diet, not taking in enough fluids, and taking some medicines may make these problems worse. MEDICATIONS THAT MAY CAUSE CONSTIPATION  Water pills (diuretics).   Calcium channel blockers (used to control blood pressure and for the heart).   Certain pain medicines (narcotics).   Anticholinergics.   Anti-inflammatory agents.   Antacids that contain aluminum.  DISEASES THAT CONTRIBUTE TO CONSTIPATION  Diabetes.   Parkinson's disease.   Dementia.   Stroke.   Depression.   Illnesses that cause problems with salt and water metabolism.  HOME CARE INSTRUCTIONS   Constipation is usually best cared for without medicines. Increasing dietary fiber and eating more fruits and vegetables is the best way to manage constipation.   Slowly increase fiber intake to 25 to 38 grams per day. Whole grains, fruits, vegetables, and legumes are good sources of fiber. A dietitian can further help you incorporate high-fiber foods into your diet.   Drink enough water and fluids to keep your urine clear or pale yellow.   A fiber supplement may be added to your diet if you cannot get enough fiber from foods.   Increasing your activities also helps improve regularity.   Suppositories, as suggested by your caregiver, will also help. If you are using antacids, such as aluminum or calcium containing products, it will be helpful to switch to products containing magnesium if your caregiver says it is okay.   If you have been given a liquid  injection (enema) today, this is only a temporary measure. It should not be relied on for treatment of longstanding (chronic) constipation.   Stronger measures, such as magnesium sulfate, should be avoided if possible. This may cause uncontrollable diarrhea. Using magnesium sulfate may not allow you time to make it to the bathroom.  SEEK IMMEDIATE MEDICAL CARE IF:   There is bright red blood in the stool.   The constipation stays for more than 4 days.   There is belly (abdominal) or rectal pain.   You do not seem to be getting better.   You have any questions or concerns.  MAKE SURE YOU:   Understand these instructions.   Will watch your condition.   Will get help right away if you are not doing well or get worse.  Document Released: 05/31/2004 Document Revised: 05/15/2011 Document Reviewed: 04/20/2008 [facility] Patient Information 2012 [place], [place].

## 2011-09-26 NOTE — Progress Notes (Signed)
Patient was seen office today her final postop visit she is status post transvaginal hysterectomy with ovarian conservation secondary to menorrhagia and anemia. She is doing well with the exception that she suffers from constipation long before hysterectomy. She states she suffers from constipation since she was a child. She states she has a bowel movement once a week. On discharge from the hospital her hemoglobin was 10.3.  Exam: Bartholin urethra Skene glands: Within normal limits Vagina: No gross lesions on inspection, vaginal cuff completely healed slight erythematous area which was contained with silver nitrate. Otherwise unremarkable. Bimanual examination no palpable masses or tenderness  Assessment: 6 weeks post transvaginal hysterectomy doing well. History of chronic constipation.  Plan: She was instructed to increase her fluid intake as well as fiber and fruit and vegetable to be incorporating to her diet. She is going to take MiraLax 1 tablespoon daily and will be referred to the gastroenterologist for further evaluation. She was released to return back to work next week. We'll check her CBC today if her hemoglobin was back to normal she'll discontinue iron supplementation.

## 2011-09-27 ENCOUNTER — Telehealth: Payer: Self-pay | Admitting: *Deleted

## 2011-09-27 NOTE — Telephone Encounter (Signed)
Pt informed with the below note. 

## 2011-09-27 NOTE — Telephone Encounter (Signed)
Message copied by Aura Camps on Fri Sep 27, 2011 12:10 PM ------      Message from: Ok Edwards      Created: Caleen Essex Sep 27, 2011 11:00 AM       Please inform patient that her hemoglobin was 11 g. I would like her to stay on her arm tablet for another month before she discontinues it.

## 2011-10-01 ENCOUNTER — Encounter: Payer: Self-pay | Admitting: *Deleted

## 2011-10-01 NOTE — Progress Notes (Signed)
Patient ID: Latoya Murphy, female   DOB: 03/15/1976, 36 y.o.   MRN: [id number] Pt called wanting recent labs faxed to dr. Loreta Ave office pt has appointment made with her. Recent labs faxed to office.

## 2011-10-18 ENCOUNTER — Encounter: Payer: Self-pay | Admitting: *Deleted

## 2011-10-18 NOTE — Progress Notes (Signed)
Patient ID: Latoya Murphy, female   DOB: [birth year], 36 y.o.   MRN: [id number] Pt called wanting JF write rx for weight loss medication, left message on pt vm that he does not write rx for that, pt said she went to weight loss clinic before.  I advised pt to try that this time as well.

## 2012-02-07 ENCOUNTER — Encounter: Payer: Self-pay | Admitting: Gynecology

## 2012-02-07 ENCOUNTER — Ambulatory Visit (INDEPENDENT_AMBULATORY_CARE_PROVIDER_SITE_OTHER): Payer: 59 | Admitting: Gynecology

## 2012-02-07 VITALS — BP 126/80 | Ht 62.75 in | Wt 183.0 lb

## 2012-02-07 DIAGNOSIS — K5900 Constipation, unspecified: Secondary | ICD-10-CM | POA: Insufficient documentation

## 2012-02-07 DIAGNOSIS — R635 Abnormal weight gain: Secondary | ICD-10-CM

## 2012-02-07 DIAGNOSIS — Z833 Family history of diabetes mellitus: Secondary | ICD-10-CM

## 2012-02-07 DIAGNOSIS — Z01419 Encounter for gynecological examination (general) (routine) without abnormal findings: Secondary | ICD-10-CM

## 2012-02-07 DIAGNOSIS — D649 Anemia, unspecified: Secondary | ICD-10-CM

## 2012-02-07 LAB — CBC WITH DIFFERENTIAL/PLATELET
Basophils Absolute: 0 10*3/uL (ref 0.0–0.1)
HCT: 34.1 % — ABNORMAL LOW (ref 36.0–46.0)
Hemoglobin: 11.4 g/dL — ABNORMAL LOW (ref 12.0–15.0)
Lymphocytes Relative: 37 % (ref 12–46)
Monocytes Absolute: 0.5 10*3/uL (ref 0.1–1.0)
Monocytes Relative: 9 % (ref 3–12)
Neutro Abs: 2.9 10*3/uL (ref 1.7–7.7)
WBC: 5.5 10*3/uL (ref 4.0–10.5)

## 2012-02-07 LAB — HEMOGLOBIN A1C: Mean Plasma Glucose: 126 mg/dL — ABNORMAL HIGH (ref <117)

## 2012-02-07 NOTE — Patient Instructions (Signed)
Cholesterol Control Diet  Cholesterol levels in your body are determined significantly by your diet. Cholesterol levels may also be related to heart disease. The following material helps to explain this relationship and discusses what you can do to help keep your heart healthy. Not all cholesterol is bad. Low-density lipoprotein (LDL) cholesterol is the "bad" cholesterol. It may cause fatty deposits to build up inside your arteries. High-density lipoprotein (HDL) cholesterol is "good." It helps to remove the "bad" LDL cholesterol from your blood. Cholesterol is a very important risk factor for heart disease. Other risk factors are high blood pressure, smoking, stress, heredity, and weight. The heart muscle gets its supply of blood through the coronary arteries. If your LDL cholesterol is high and your HDL cholesterol is low, you are at risk for having fatty deposits build up in your coronary arteries. This leaves less room through which blood can flow. Without sufficient blood and oxygen, the heart muscle cannot function properly and you may feel chest pains (angina pectoris). When a coronary artery closes up entirely, a part of the heart muscle may die, causing a heart attack (myocardial infarction). CHECKING CHOLESTEROL When your caregiver sends your blood to a lab to be analyzed for cholesterol, a complete lipid (fat) profile may be done. With this test, the total amount of cholesterol and levels of LDL and HDL are determined. Triglycerides are a type of fat that circulates in the blood and can also be used to determine heart disease risk. The list below describes what the numbers should be: Test: Total Cholesterol.  Less than 200 mg/dl.  Test: LDL "bad cholesterol."  Less than 100 mg/dl.   Less than 70 mg/dl if you are at very high risk of a heart attack or sudden cardiac death.  Test: HDL "good cholesterol."  Greater than 50 mg/dl for women.    Greater than 40 mg/dl for men.  Test: Triglycerides.  Less than 150 mg/dl.  CONTROLLING CHOLESTEROL WITH DIET Although exercise and lifestyle factors are important, your diet is key. That is because certain foods are known to raise cholesterol and others to lower it. The goal is to balance foods for their effect on cholesterol and more importantly, to replace saturated and trans fat with other types of fat, such as monounsaturated fat, polyunsaturated fat, and omega-3 fatty acids. On average, a person should consume no more than 15 to 17 g of saturated fat daily. Saturated and trans fats are considered "bad" fats, and they will raise LDL cholesterol. Saturated fats are primarily found in animal products such as meats, butter, and cream. However, that does not mean you need to sacrifice all your favorite foods. Today, there are good tasting, low-fat, low-cholesterol substitutes for most of the things you like to eat. Choose low-fat or nonfat alternatives. Choose round or loin cuts of red meat, since these types of cuts are lowest in fat and cholesterol. Chicken (without the skin), fish, veal, and ground turkey breast are excellent choices. Eliminate fatty meats, such as hot dogs and salami. Even shellfish have little or no saturated fat. Have a 3 oz (85 g) portion when you eat lean meat, poultry, or fish. Trans fats are also called "partially hydrogenated oils." They are oils that have been scientifically manipulated so that they are solid at room temperature resulting in a longer shelf life and improved taste and texture of foods in which they are added. Trans fats are found in stick margarine, some tub margarines, cookies, crackers, and baked goods.    When baking and cooking, oils are an excellent substitute for butter. The monounsaturated oils are especially beneficial since it is believed they lower LDL and raise HDL. The oils you should avoid entirely are saturated tropical oils, such as coconut and  palm.  Remember to eat liberally from food groups that are naturally free of saturated and trans fat, including fish, fruit, vegetables, beans, grains (barley, rice, couscous, bulgur wheat), and pasta (without cream sauces).  IDENTIFYING FOODS THAT LOWER CHOLESTEROL  Soluble fiber may lower your cholesterol. This type of fiber is found in fruits such as apples, vegetables such as broccoli, potatoes, and carrots, legumes such as beans, peas, and lentils, and grains such as barley. Foods fortified with plant sterols (phytosterol) may also lower cholesterol. You should eat at least 2 g per day of these foods for a cholesterol lowering effect.  Read package labels to identify low-saturated fats, trans fats free, and low-fat foods at the supermarket. Select cheeses that have only 2 to 3 g saturated fat per ounce. Use a heart-healthy tub margarine that is free of trans fats or partially hydrogenated oil. When buying baked goods (cookies, crackers), avoid partially hydrogenated oils. Breads and muffins should be made from whole grains (whole-wheat or whole oat flour, instead of "flour" or "enriched flour"). Buy non-creamy canned soups with reduced salt and no added fats.  FOOD PREPARATION TECHNIQUES  Never deep-fry. If you must fry, either stir-fry, which uses very little fat, or use non-stick cooking sprays. When possible, broil, bake, or roast meats, and steam vegetables. Instead of dressing vegetables with butter or margarine, use lemon and herbs, applesauce and cinnamon (for squash and sweet potatoes), nonfat yogurt, salsa, and low-fat dressings for salads.  LOW-SATURATED FAT / LOW-FAT FOOD SUBSTITUTES Meats / Saturated Fat (g)  Avoid: Steak, marbled (3 oz/85 g) / 11 g   Choose: Steak, lean (3 oz/85 g) / 4 g   Avoid: Hamburger (3 oz/85 g) / 7 g   Choose: Hamburger, lean (3 oz/85 g) / 5 g   Avoid: Ham (3 oz/85 g) / 6 g   Choose: Ham, lean cut (3 oz/85 g) / 2.4 g   Avoid: Chicken, with skin, dark  meat (3 oz/85 g) / 4 g   Choose: Chicken, skin removed, dark meat (3 oz/85 g) / 2 g   Avoid: Chicken, with skin, light meat (3 oz/85 g) / 2.5 g   Choose: Chicken, skin removed, light meat (3 oz/85 g) / 1 g  Dairy / Saturated Fat (g)  Avoid: Whole milk (1 cup) / 5 g   Choose: Low-fat milk, 2% (1 cup) / 3 g   Choose: Low-fat milk, 1% (1 cup) / 1.5 g   Choose: Skim milk (1 cup) / 0.3 g   Avoid: Hard cheese (1 oz/28 g) / 6 g   Choose: Skim milk cheese (1 oz/28 g) / 2 to 3 g   Avoid: Cottage cheese, 4% fat (1 cup) / 6.5 g   Choose: Low-fat cottage cheese, 1% fat (1 cup) / 1.5 g   Avoid: Ice cream (1 cup) / 9 g   Choose: Sherbet (1 cup) / 2.5 g   Choose: Nonfat frozen yogurt (1 cup) / 0.3 g   Choose: Frozen fruit bar / trace   Avoid: Whipped cream (1 tbs) / 3.5 g   Choose: Nondairy whipped topping (1 tbs) / 1 g  Condiments / Saturated Fat (g)  Avoid: Mayonnaise (1 tbs) / 2 g   Choose: Low-fat   mayonnaise (1 tbs) / 1 g   Avoid: Butter (1 tbs) / 7 g   Choose: Extra light margarine (1 tbs) / 1 g   Avoid: Coconut oil (1 tbs) / 11.8 g   Choose: Olive oil (1 tbs) / 1.8 g   Choose: Corn oil (1 tbs) / 1.7 g   Choose: Safflower oil (1 tbs) / 1.2 g   Choose: Sunflower oil (1 tbs) / 1.4 g   Choose: Soybean oil (1 tbs) / 2.4 g   Choose: Canola oil (1 tbs) / 1 g  Document Released: 09/02/2005 Document Revised: 05/15/2011 Document Reviewed: 02/21/2011 [employer] Patient Information 2012 [employer].  Exercise to Lose Weight Exercise and a healthy diet may help you lose weight. Your doctor may suggest specific exercises. EXERCISE IDEAS AND TIPS  Choose low-cost things you enjoy doing, such as walking, bicycling, or exercising to workout videos.   Take stairs instead of the elevator.   Walk during your lunch break.   Park your car further away from work or school.   Go to a gym or an exercise class.   Start with 5 to 10 minutes of exercise each day. Build up to  30 minutes of exercise 4 to 6 days a week.   Wear shoes with good support and comfortable clothes.   Stretch before and after working out.   Work out until you breathe harder and your heart beats faster.   Drink extra water when you exercise.   Do not do so much that you hurt yourself, feel dizzy, or get very short of breath.  Exercises that burn about 150 calories:  Running 1  miles in 15 minutes.   Playing volleyball for 45 to 60 minutes.   Washing and waxing a car for 45 to 60 minutes.   Playing touch football for 45 minutes.   Walking 1  miles in 35 minutes.   Pushing a stroller 1  miles in 30 minutes.   Playing basketball for 30 minutes.   Raking leaves for 30 minutes.   Bicycling 5 miles in 30 minutes.   Walking 2 miles in 30 minutes.   Dancing for 30 minutes.   Shoveling snow for 15 minutes.   Swimming laps for 20 minutes.   Walking up stairs for 15 minutes.   Bicycling 4 miles in 15 minutes.   Gardening for 30 to 45 minutes.   Jumping rope for 15 minutes.   Washing windows or floors for 45 to 60 minutes.  Document Released: 10/05/2010 Document Revised: 05/15/2011 Document Reviewed: 10/05/2010 [employer] Patient Information 2012 [employer].  

## 2012-02-07 NOTE — Progress Notes (Signed)
Latoya Murphy November 27, 1975 [id number]   History:    36 y.o.  for annual gyn exam with only complaint of occasional constipation. Patient status post transvaginal hysterectomy in 2012 as a result of dysmenorrhea and menorrhagia and anemia. Her last hemoglobin recorded here in the office was in January 2013 and was 11.0. She is currently taking iron supplementation 1 tablet daily. She had been referred last year to the gastroenterologist and she saw Dr. Loreta Ave. She's currently on stool softeners and is helping her along with dietary modification. She frequently does her self breast examination.  Past medical history,surgical history, family history and social history were all reviewed and documented in the EPIC chart.  Gynecologic History Patient's last menstrual period was 06/09/2011. Contraception: Prior hysterectomy Last Pap: 2012. Results were: normal Last mammogram: No prior study. Results were: No prior study  Obstetric History OB History    Grav Para Term Preterm Abortions TAB SAB Ect Mult Living   3 3 3       3      # Outc Date GA Lbr Len/2nd Wgt Sex Del Anes PTL Lv   1 TRM      SVD   Yes   2 TRM      SVD   Yes   3 TRM      SVD   Yes       ROS: A ROS was performed and pertinent positives and negatives are included in the history.  GENERAL: No fevers or chills. HEENT: No change in vision, no earache, sore throat or sinus congestion. NECK: No pain or stiffness. CARDIOVASCULAR: No chest pain or pressure. No palpitations. PULMONARY: No shortness of breath, cough or wheeze. GASTROINTESTINAL: No abdominal pain, nausea, vomiting or diarrhea, melena or bright red blood per rectum, but has complaining of constipation. GENITOURINARY: No urinary frequency, urgency, hesitancy or dysuria. MUSCULOSKELETAL: No joint or muscle pain, no back pain, no recent trauma. DERMATOLOGIC: No rash, no itching, no lesions. ENDOCRINE: No polyuria, polydipsia, no heat or cold intolerance. No recent change in  weight. HEMATOLOGICAL: No anemia or easy bruising or bleeding. NEUROLOGIC: No headache, seizures, numbness, tingling or weakness. PSYCHIATRIC: No depression, no loss of interest in normal activity or change in sleep pattern.     Exam: chaperone present  BP 126/80  Ht 5' 2.75" (1.594 m)  Wt 183 lb (83.008 kg)  BMI 32.68 kg/m2  LMP 06/09/2011  Body mass index is 32.68 kg/(m^2).  General appearance : Well developed well nourished female. No acute distress HEENT: Neck supple, trachea midline, no carotid bruits, no thyroidmegaly Lungs: Clear to auscultation, no rhonchi or wheezes, or rib retractions  Heart: Regular rate and rhythm, no murmurs or gallops Breast:Examined in sitting and supine position were symmetrical in appearance, no palpable masses or tenderness,  no skin retraction, no nipple inversion, no nipple discharge, no skin discoloration, no axillary or supraclavicular lymphadenopathy Abdomen: no palpable masses or tenderness, no rebound or guarding Extremities: no edema or skin discoloration or tenderness  Pelvic:  Bartholin, Urethra, Skene Glands: Within normal limits             Vagina: No gross lesions or discharge  Cervix: Absent  Uterus  absent  Adnexa  Without masses or tenderness  Anus and perineum  normal   Rectovaginal  not done             Hemoccult not done     Assessment/Plan:  36 y.o. female for annual exam . New Pap smear screening guidelines discussed.  Patient has always had normal Pap smears and will no longer need them. The following labs will be drawn today: Hemoglobin A1c, total cholesterol, CBC, urinalysis, and TSH. Literature information on exercise and diet was provided. She was encouraged to do her monthly self breast examination. Have her CBC results come back normal she will discontinue the iron supplementation and this will help with her constipation as well.   Ok Edwards MD, 11:43 AM 02/07/2012

## 2012-02-08 LAB — URINALYSIS W MICROSCOPIC + REFLEX CULTURE
Glucose, UA: NEGATIVE mg/dL
Leukocytes, UA: NEGATIVE
Nitrite: NEGATIVE
Protein, ur: NEGATIVE mg/dL
Squamous Epithelial / LPF: NONE SEEN
pH: 5.5 (ref 5.0–8.0)

## 2012-02-11 ENCOUNTER — Other Ambulatory Visit: Payer: Self-pay | Admitting: *Deleted

## 2012-02-11 DIAGNOSIS — R7309 Other abnormal glucose: Secondary | ICD-10-CM

## 2012-02-12 ENCOUNTER — Other Ambulatory Visit: Payer: 59

## 2012-02-12 DIAGNOSIS — R7309 Other abnormal glucose: Secondary | ICD-10-CM

## 2012-04-22 ENCOUNTER — Other Ambulatory Visit: Payer: Self-pay | Admitting: *Deleted

## 2012-04-22 MED ORDER — SUMATRIPTAN SUCCINATE 50 MG PO TABS: 50.0000 mg | ORAL_TABLET | ORAL | Status: DC | PRN

## 2012-06-18 ENCOUNTER — Ambulatory Visit (INDEPENDENT_AMBULATORY_CARE_PROVIDER_SITE_OTHER): Payer: 59 | Admitting: Gynecology

## 2012-06-18 DIAGNOSIS — Z23 Encounter for immunization: Secondary | ICD-10-CM

## 2013-02-10 ENCOUNTER — Ambulatory Visit (INDEPENDENT_AMBULATORY_CARE_PROVIDER_SITE_OTHER): Payer: 59 | Admitting: Gynecology

## 2013-02-10 ENCOUNTER — Encounter: Payer: Self-pay | Admitting: Gynecology

## 2013-02-10 ENCOUNTER — Other Ambulatory Visit: Payer: Self-pay | Admitting: Gynecology

## 2013-02-10 VITALS — BP 124/72 | Ht 62.0 in | Wt 185.0 lb

## 2013-02-10 DIAGNOSIS — Z23 Encounter for immunization: Secondary | ICD-10-CM

## 2013-02-10 DIAGNOSIS — R635 Abnormal weight gain: Secondary | ICD-10-CM

## 2013-02-10 DIAGNOSIS — Z01419 Encounter for gynecological examination (general) (routine) without abnormal findings: Secondary | ICD-10-CM

## 2013-02-10 LAB — CBC WITH DIFFERENTIAL/PLATELET
Basophils Absolute: 0 10*3/uL (ref 0.0–0.1)
Basophils Relative: 0 % (ref 0–1)
Eosinophils Relative: 1 % (ref 0–5)
HCT: 33.9 % — ABNORMAL LOW (ref 36.0–46.0)
MCHC: 34.5 g/dL (ref 30.0–36.0)
Monocytes Absolute: 0.3 10*3/uL (ref 0.1–1.0)
Neutro Abs: 2.5 10*3/uL (ref 1.7–7.7)
Platelets: 280 10*3/uL (ref 150–400)
RDW: 14.2 % (ref 11.5–15.5)

## 2013-02-10 LAB — LIPID PANEL
HDL: 40 mg/dL (ref >39)
Total CHOL/HDL Ratio: 4 Ratio
VLDL: 13 mg/dL (ref 0–40)

## 2013-02-10 LAB — HEMOGLOBIN A1C: Mean Plasma Glucose: 117 mg/dL — ABNORMAL HIGH (ref <117)

## 2013-02-10 LAB — TSH: TSH: 0.793 u[IU]/mL (ref 0.350–4.500)

## 2013-02-10 NOTE — Progress Notes (Addendum)
Latoya Murphy 19-Mar-1976 [id number]   History:    37 y.o.  for annual gyn exam with no complaints today with exception of weight gain. Review of patient's records indicated that she had a TVH back in 2012 and has done well since her surgery. 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 Pathology report from [facility] demonstrated abvormal cervical pathology. The patient has not received her TDAP vaccine  Past medical history,surgical history, family history and social history were all reviewed and documented in the EPIC chart.  Gynecologic History Patient's last menstrual period was 06/09/2011. Contraception: status post hysterectomy Last Pap: 2012. Results were: normal Last mammogram: none indicated. Results were: none indicated  Obstetric History OB History   Grav Para Term Preterm Abortions TAB SAB Ect Mult Living   3 3 3       3      # Outc Date GA Lbr Len/2nd Wgt Sex Del Anes PTL Lv   1 TRM      SVD   Yes   2 TRM      SVD   Yes   3 TRM      SVD   Yes       ROS: A ROS was performed and pertinent positives and negatives are included in the history.  GENERAL: No fevers or chills. HEENT: No change in vision, no earache, sore throat or sinus congestion. NECK: No pain or stiffness. CARDIOVASCULAR: No chest pain or pressure. No palpitations. PULMONARY: No shortness of breath, cough or wheeze. GASTROINTESTINAL: No abdominal pain, nausea, vomiting or diarrhea, melena or bright red blood per rectum. GENITOURINARY: No urinary frequency, urgency, hesitancy or dysuria. MUSCULOSKELETAL: No joint or muscle pain, no back pain, no recent trauma. DERMATOLOGIC: No rash, no itching, no lesions. ENDOCRINE: No polyuria, polydipsia, no heat or cold intolerance. No recent change in weight. HEMATOLOGICAL: No anemia or easy bruising or bleeding. NEUROLOGIC: No headache, seizures, numbness, tingling or weakness. PSYCHIATRIC: No depression, no loss of  interest in normal activity or change in sleep pattern.     Exam: chaperone present  BP 124/72  Ht 5\' 2"  (1.575 m)  Wt 185 lb (83.915 kg)  BMI 33.83 kg/m2  LMP 06/09/2011  Body mass index is 33.83 kg/(m^2).  General appearance : Well developed well nourished female. No acute distress HEENT: Neck supple, trachea midline, no carotid bruits, no thyroidmegaly Lungs: Clear to auscultation, no rhonchi or wheezes, or rib retractions  Heart: Regular rate and rhythm, no murmurs or gallops Breast:Examined in sitting and supine position were symmetrical in appearance, no palpable masses or tenderness,  no skin retraction, no nipple inversion, no nipple discharge, no skin discoloration, no axillary or supraclavicular lymphadenopathy Abdomen: no palpable masses or tenderness, no rebound or guarding Extremities: no edema or skin discoloration or tenderness  Pelvic:  Bartholin, Urethra, Skene Glands: Within normal limits             Vagina: No gross lesions or discharge  Cervix: absent  Uterus Absent  Adnexa  Without masses or tenderness  Anus and perineum  normal   Rectovaginal  normal sphincter tone without palpated masses or tenderness             Hemoccult None indicated     Assessment/Plan:  37 y.o. female for annual exam who was reminded of the importance of monthly self breast examination. Literature information on exercise and diet was provided. The following labs were ordered:CBC, hemoglobin  A1c, and lipid profile, TSH and urinalysis. No Pap smear done today. The new guidelines discussed. Patient did receive that Tdap vaccine today.   Ok Edwards MD, 9:41 AM 02/10/2013

## 2013-02-10 NOTE — Patient Instructions (Signed)
Patient information: High cholesterol (The Basics)  What is cholesterol? - Cholesterol is a substance that is found in the blood. Everyone has some. It is needed for good health. The problem is, people sometimes have too much cholesterol. Compared with people with normal cholesterol, people with high cholesterol have a higher risk of heart attacks, strokes, and other health problems. The higher your cholesterol, the higher your risk of these problems. Cholesterol levels in your body are determined significantly by your diet. Cholesterol levels may also be related to heart disease. The following material helps to explain this relationship and discusses what you can do to help keep your heart healthy. Not all cholesterol is bad. Low-density lipoprotein (LDL) cholesterol is the "bad" cholesterol. It may cause fatty deposits to build up inside your arteries. High-density lipoprotein (HDL) cholesterol is "good." It helps to remove the "bad" LDL cholesterol from your blood. Cholesterol is a very important risk factor for heart disease. Other risk factors are high blood pressure, smoking, stress, heredity, and weight.  The heart muscle gets its supply of blood through the coronary arteries. If your LDL cholesterol is high and your HDL cholesterol is low, you are at risk for having fatty deposits build up in your coronary arteries. This leaves less room through which blood can flow. Without sufficient blood and oxygen, the heart muscle cannot function properly and you may feel chest pains (angina pectoris). When a coronary artery closes up entirely, a part of the heart muscle may die, causing a heart attack (myocardial infarction).  CHECKING CHOLESTEROL When your caregiver sends your blood to a lab to be analyzed for cholesterol, a complete lipid (fat) profile may be done. With this test, the total amount of cholesterol and levels of LDL and HDL are determined. Triglycerides  are a type of fat that circulates in the blood and can also be used to determine heart disease risk. Are there different types of cholesterol? - Yes, there are a few different types. If you get a cholesterol test, you may hear your doctor or nurse talk about: Total cholesterol  LDL cholesterol - Some people call this the "bad" cholesterol. That's because having high LDL levels raises your risk of heart attacks, strokes, and other health problems.  HDL cholesterol - Some people call this the "good" cholesterol. That's because having high HDL levels lowers your risk of heart attacks, strokes, and other health problems.  Non-HDL cholesterol - Non-HDL cholesterol is your total cholesterol minus your HDL cholesterol.  Triglycerides - Triglycerides are not cholesterol. They are a type of fat. But they often get measured when cholesterol is measured. (Having high triglycerides also seems to increase the risk of heart attacks and strokes.)   Keep in mind, though, that many people who cannot meet these goals still have a low risk of heart attacks and strokes. What should I do if my doctor tells me I have high cholesterol? - Ask your doctor what your overall risk of heart attacks and strokes is. High cholesterol, by itself, is not always a reason to worry. Having high cholesterol is just one of many things that can increase your risk of heart attacks and strokes. Other factors that increase your risk include:  Cigarette smoking  High blood pressure  Having a parent, sister, or brother who got heart disease at a young age (Young, in this case, means younger than 55 for men and younger than 65 for women.)  Being a man (Women are at risk, too, but men   have a higher risk.)  Older age  If you are at high risk of heart attacks and strokes, having high cholesterol is a problem. On the other hand, if you have are at low risk, having high cholesterol may not mean much. Should I take medicine to lower cholesterol? - Not  everyone who has high cholesterol needs medicines. Your doctor or nurse will decide if you need them based on your age, family history, and other health concerns.  You should probably take a cholesterol-lowering medicine called a statin if you: Already had a heart attack or stroke  Have known heart disease  Have diabetes  Have a condition called peripheral artery disease, which makes it painful to walk, and happens when the arteries in your legs get clogged with fatty deposits  Have an abdominal aortic aneurysm, which is a widening of the main artery in the belly  Most people with any of the conditions listed above should take a statin no matter what their cholesterol level is. If your doctor or nurse puts you on a statin, stay on it. The medicine may not make you feel any different. But it can help prevent heart attacks, strokes, and death.  Can I lower my cholesterol without medicines? - Yes, you can lower your cholesterol some by:  Avoiding red meat, butter, fried foods, cheese, and other foods that have a lot of saturated fat  Losing weight (if you are overweight)  Being more active Even if these steps do little to change your cholesterol, they can improve your health in many ways.                                                   Cholesterol Control Diet  CONTROLLING CHOLESTEROL WITH DIET Although exercise and lifestyle factors are important, your diet is key. That is because certain foods are known to raise cholesterol and others to lower it. The goal is to balance foods for their effect on cholesterol and more importantly, to replace saturated and trans fat with other types of fat, such as monounsaturated fat, polyunsaturated fat, and omega-3 fatty acids. On average, a person should consume no more than 15 to 17 g of saturated fat daily. Saturated and trans fats are considered "bad" fats, and they will raise LDL cholesterol. Saturated fats are primarily found in animal products such as  meats, butter, and cream. However, that does not mean you need to sacrifice all your favorite foods. Today, there are good tasting, low-fat, low-cholesterol substitutes for most of the things you like to eat. Choose low-fat or nonfat alternatives. Choose round or loin cuts of red meat, since these types of cuts are lowest in fat and cholesterol. Chicken (without the skin), fish, veal, and ground turkey breast are excellent choices. Eliminate fatty meats, such as hot dogs and salami. Even shellfish have little or no saturated fat. Have a 3 oz (85 g) portion when you eat lean meat, poultry, or fish. Trans fats are also called "partially hydrogenated oils." They are oils that have been scientifically manipulated so that they are solid at room temperature resulting in a longer shelf life and improved taste and texture of foods in which they are added. Trans fats are found in stick margarine, some tub margarines, cookies, crackers, and baked goods.  When baking and cooking, oils are an excellent substitute   for butter. The monounsaturated oils are especially beneficial since it is believed they lower LDL and raise HDL. The oils you should avoid entirely are saturated tropical oils, such as coconut and palm.  Remember to eat liberally from food groups that are naturally free of saturated and trans fat, including fish, fruit, vegetables, beans, grains (barley, rice, couscous, bulgur wheat), and pasta (without cream sauces).  IDENTIFYING FOODS THAT LOWER CHOLESTEROL  Soluble fiber may lower your cholesterol. This type of fiber is found in fruits such as apples, vegetables such as broccoli, potatoes, and carrots, legumes such as beans, peas, and lentils, and grains such as barley. Foods fortified with plant sterols (phytosterol) may also lower cholesterol. You should eat at least 2 g per day of these foods for a cholesterol lowering effect.  Read package labels to identify low-saturated fats, trans fats free, and  low-fat foods at the supermarket. Select cheeses that have only 2 to 3 g saturated fat per ounce. Use a heart-healthy tub margarine that is free of trans fats or partially hydrogenated oil. When buying baked goods (cookies, crackers), avoid partially hydrogenated oils. Breads and muffins should be made from whole grains (whole-wheat or whole oat flour, instead of "flour" or "enriched flour"). Buy non-creamy canned soups with reduced salt and no added fats.  FOOD PREPARATION TECHNIQUES  Never deep-fry. If you must fry, either stir-fry, which uses very little fat, or use non-stick cooking sprays. When possible, broil, bake, or roast meats, and steam vegetables. Instead of dressing vegetables with butter or margarine, use lemon and herbs, applesauce and cinnamon (for squash and sweet potatoes), nonfat yogurt, salsa, and low-fat dressings for salads.  LOW-SATURATED FAT / LOW-FAT FOOD SUBSTITUTES Meats / Saturated Fat (g)  Avoid: Steak, marbled (3 oz/85 g) / 11 g   Choose: Steak, lean (3 oz/85 g) / 4 g   Avoid: Hamburger (3 oz/85 g) / 7 g   Choose: Hamburger, lean (3 oz/85 g) / 5 g   Avoid: Ham (3 oz/85 g) / 6 g   Choose: Ham, lean cut (3 oz/85 g) / 2.4 g   Avoid: Chicken, with skin, dark meat (3 oz/85 g) / 4 g   Choose: Chicken, skin removed, dark meat (3 oz/85 g) / 2 g   Avoid: Chicken, with skin, light meat (3 oz/85 g) / 2.5 g   Choose: Chicken, skin removed, light meat (3 oz/85 g) / 1 g  Dairy / Saturated Fat (g)  Avoid: Whole milk (1 cup) / 5 g   Choose: Low-fat milk, 2% (1 cup) / 3 g   Choose: Low-fat milk, 1% (1 cup) / 1.5 g   Choose: Skim milk (1 cup) / 0.3 g   Avoid: Hard cheese (1 oz/28 g) / 6 g   Choose: Skim milk cheese (1 oz/28 g) / 2 to 3 g   Avoid: Cottage cheese, 4% fat (1 cup) / 6.5 g   Choose: Low-fat cottage cheese, 1% fat (1 cup) / 1.5 g   Avoid: Ice cream (1 cup) / 9 g   Choose: Sherbet (1 cup) / 2.5 g   Choose: Nonfat frozen yogurt (1 cup) / 0.3 g    Choose: Frozen fruit bar / trace   Avoid: Whipped cream (1 tbs) / 3.5 g   Choose: Nondairy whipped topping (1 tbs) / 1 g  Condiments / Saturated Fat (g)  Avoid: Mayonnaise (1 tbs) / 2 g   Choose: Low-fat mayonnaise (1 tbs) / 1 g   Avoid:   Butter (1 tbs) / 7 g   Choose: Extra light margarine (1 tbs) / 1 g   Avoid: Coconut oil (1 tbs) / 11.8 g   Choose: Olive oil (1 tbs) / 1.8 g   Choose: Corn oil (1 tbs) / 1.7 g   Choose: Safflower oil (1 tbs) / 1.2 g   Choose: Sunflower oil (1 tbs) / 1.4 g   Choose: Soybean oil (1 tbs) / 2.4 g   Choose: Canola oil (1 tbs) / 1 g  Exercise to Lose Weight Exercise and a healthy diet may help you lose weight. Your doctor may suggest specific exercises. EXERCISE IDEAS AND TIPS  Choose low-cost things you enjoy doing, such as walking, bicycling, or exercising to workout videos.   Take stairs instead of the elevator.   Walk during your lunch break.   Park your car further away from work or school.   Go to a gym or an exercise class.   Start with 5 to 10 minutes of exercise each day. Build up to 30 minutes of exercise 4 to 6 days a week.   Wear shoes with good support and comfortable clothes.   Stretch before and after working out.   Work out until you breathe harder and your heart beats faster.   Drink extra water when you exercise.   Do not do so much that you hurt yourself, feel dizzy, or get very short of breath.  Exercises that burn about 150 calories:  Running 1  miles in 15 minutes.   Playing volleyball for 45 to 60 minutes.   Washing and waxing a car for 45 to 60 minutes.   Playing touch football for 45 minutes.   Walking 1  miles in 35 minutes.   Pushing a stroller 1  miles in 30 minutes.   Playing basketball for 30 minutes.   Raking leaves for 30 minutes.   Bicycling 5 miles in 30 minutes.   Walking 2 miles in 30 minutes.   Dancing for 30 minutes.   Shoveling snow for 15 minutes.   Swimming laps  for 20 minutes.   Walking up stairs for 15 minutes.   Bicycling 4 miles in 15 minutes.   Gardening for 30 to 45 minutes.   Jumping rope for 15 minutes.   Washing windows or floors for 45 to 60 minutes.  Document Released: 10/05/2010 Document Revised: 05/15/2011 Document Reviewed: 10/05/2010 [employer] Patient Information 2012 [employer].Tetanus, Diphtheria, Pertussis (Tdap) Vaccine What You Need to Know WHY GET VACCINATED? Tetanus, diphtheria and pertussis can be very serious diseases, even for adolescents and adults. Tdap vaccine can protect us from these diseases. TETANUS (Lockjaw) causes painful muscle tightening and stiffness, usually all over the body.  It can lead to tightening of muscles in the head and neck so you can't open your mouth, swallow, or sometimes even breathe. Tetanus kills about 1 out of 5 people who are infected. DIPHTHERIA can cause a thick coating to form in the back of the throat.  It can lead to breathing problems, paralysis, heart failure, and death. PERTUSSIS (Whooping Cough) causes severe coughing spells, which can cause difficulty breathing, vomiting and disturbed sleep.  It can also lead to weight loss, incontinence, and rib fractures. Up to 2 in 100 adolescents and 5 in 100 adults with pertussis are hospitalized or have complications, which could include pneumonia and death. These diseases are caused by bacteria. Diphtheria and pertussis are spread from person to person through coughing or sneezing. Tetanus   enters the body through cuts, scratches, or wounds. Before vaccines, the [place] saw as many as 200,000 cases a year of diphtheria and pertussis, and hundreds of cases of tetanus. Since vaccination began, tetanus and diphtheria have dropped by about 99% and pertussis by about 80%. TDAP VACCINE Tdap vaccine can protect adolescents and adults from tetanus, diphtheria, and pertussis. One dose of Tdap is routinely given at age 11 or 12. People who  did not get Tdap at that age should get it as soon as possible. Tdap is especially important for health care professionals and anyone having close contact with a baby younger than 12 months. Pregnant women should get a dose of Tdap during every pregnancy, to protect the newborn from pertussis. Infants are most at risk for severe, life-threatening complications from pertussis. A similar vaccine, called Td, protects from tetanus and diphtheria, but not pertussis. A Td booster should be given every 10 years. Tdap may be given as one of these boosters if you have not already gotten a dose. Tdap may also be given after a severe cut or burn to prevent tetanus infection. Your doctor can give you more information. Tdap may safely be given at the same time as other vaccines. SOME PEOPLE SHOULD NOT GET THIS VACCINE  If you ever had a life-threatening allergic reaction after a dose of any tetanus, diphtheria, or pertussis containing vaccine, OR if you have a severe allergy to any part of this vaccine, you should not get Tdap. Tell your doctor if you have any severe allergies.  If you had a coma, or long or multiple seizures within 7 days after a childhood dose of DTP or DTaP, you should not get Tdap, unless a cause other than the vaccine was found. You can still get Td.  Talk to your doctor if you:  have epilepsy or another nervous system problem,  had severe pain or swelling after any vaccine containing diphtheria, tetanus or pertussis,  ever had Guillain-Barr Syndrome (GBS),  aren't feeling well on the day the shot is scheduled. RISKS OF A VACCINE REACTION With any medicine, including vaccines, there is a chance of side effects. These are usually mild and go away on their own, but serious reactions are also possible. Brief fainting spells can follow a vaccination, leading to injuries from falling. Sitting or lying down for about 15 minutes can help prevent these. Tell your doctor if you feel dizzy or  light-headed, or have vision changes or ringing in the ears. Mild problems following Tdap (Did not interfere with activities)  Pain where the shot was given (about 3 in 4 adolescents or 2 in 3 adults)  Redness or swelling where the shot was given (about 1 person in 5)  Mild fever of at least 100.4F (up to about 1 in 25 adolescents or 1 in 100 adults)  Headache (about 3 or 4 people in 10)  Tiredness (about 1 person in 3 or 4)  Nausea, vomiting, diarrhea, stomach ache (up to 1 in 4 adolescents or 1 in 10 adults)  Chills, body aches, sore joints, rash, swollen glands (uncommon) Moderate problems following Tdap (Interfered with activities, but did not require medical attention)  Pain where the shot was given (about 1 in 5 adolescents or 1 in 100 adults)  Redness or swelling where the shot was given (up to about 1 in 16 adolescents or 1 in 25 adults)  Fever over 102F (about 1 in 100 adolescents or 1 in 250 adults)  Headache (about   3 in 20 adolescents or 1 in 10 adults)  Nausea, vomiting, diarrhea, stomach ache (up to 1 or 3 people in 100)  Swelling of the entire arm where the shot was given (up to about 3 in 100). Severe problems following Tdap (Unable to perform usual activities, required medical attention)  Swelling, severe pain, bleeding and redness in the arm where the shot was given (rare). A severe allergic reaction could occur after any vaccine (estimated less than 1 in a million doses). WHAT IF THERE IS A SERIOUS REACTION? What should I look for?  Look for anything that concerns you, such as signs of a severe allergic reaction, very high fever, or behavior changes. Signs of a severe allergic reaction can include hives, swelling of the face and throat, difficulty breathing, a fast heartbeat, dizziness, and weakness. These would start a few minutes to a few hours after the vaccination. What should I do?  If you think it is a severe allergic reaction or other emergency that  can't wait, call 9-1-1 or get the person to the nearest hospital. Otherwise, call your doctor.  Afterward, the reaction should be reported to the "Vaccine Adverse Event Reporting System" (VAERS). Your doctor might file this report, or you can do it yourself through the VAERS web site at www.vaers.hhs.gov, or by calling 1-800-822-7967. VAERS is only for reporting reactions. They do not give medical advice.  THE NATIONAL VACCINE INJURY COMPENSATION PROGRAM The National Vaccine Injury Compensation Program (VICP) is a federal program that was created to compensate people who may have been injured by certain vaccines. Persons who believe they may have been injured by a vaccine can learn about the program and about filing a claim by calling 1-800-338-2382 or visiting the VICP website at www.hrsa.gov/vaccinecompensation. HOW CAN I LEARN MORE?  Ask your doctor.  Call your local or state health department.  Contact the Centers for Disease Control and Prevention (CDC):  Call 1-800-232-4636 or visit CDC's website at www.cdc.gov/vaccines. CDC Tdap Vaccine VIS (01/23/12) Document Released: 03/03/2012 Document Revised: 05/27/2012 Document Reviewed: 03/03/2012 [employer] Patient Information 2014 [employer].  

## 2013-02-11 LAB — URINALYSIS W MICROSCOPIC + REFLEX CULTURE
Bilirubin Urine: NEGATIVE
Ketones, ur: NEGATIVE mg/dL
Protein, ur: NEGATIVE mg/dL
Urobilinogen, UA: 0.2 mg/dL (ref 0.0–1.0)

## 2013-02-11 LAB — ANEMIA PANEL
Ferritin: 44 ng/mL (ref 10–291)
Folate: 7.5 ng/mL
Retic Ct Pct: 0.9 % (ref 0.4–2.3)
Vitamin B-12: 397 pg/mL (ref 211–911)

## 2013-02-12 LAB — URINE CULTURE
Colony Count: NO GROWTH
Organism ID, Bacteria: NO GROWTH

## 2013-02-16 NOTE — Progress Notes (Signed)
Please inform patient that I would like for you to schedule an appointment with Latoya Murphy for further evaluation of her anemia. She has had a hysterectomy and is taking her iron daily and for this reason the Murphy tract needs to be evaluated for a source for her anemia.

## 2013-02-17 ENCOUNTER — Encounter: Payer: Self-pay | Admitting: Gynecology

## 2013-02-19 ENCOUNTER — Encounter: Payer: Self-pay | Admitting: Gynecology

## 2013-02-25 ENCOUNTER — Other Ambulatory Visit: Payer: Self-pay | Admitting: Gynecology

## 2013-04-16 ENCOUNTER — Ambulatory Visit (INDEPENDENT_AMBULATORY_CARE_PROVIDER_SITE_OTHER): Payer: 59 | Admitting: Women's Health

## 2013-04-16 ENCOUNTER — Encounter: Payer: Self-pay | Admitting: Women's Health

## 2013-04-16 DIAGNOSIS — N898 Other specified noninflammatory disorders of vagina: Secondary | ICD-10-CM

## 2013-04-16 DIAGNOSIS — N760 Acute vaginitis: Secondary | ICD-10-CM

## 2013-04-16 DIAGNOSIS — B9689 Other specified bacterial agents as the cause of diseases classified elsewhere: Secondary | ICD-10-CM

## 2013-04-16 DIAGNOSIS — B379 Candidiasis, unspecified: Secondary | ICD-10-CM

## 2013-04-16 DIAGNOSIS — A499 Bacterial infection, unspecified: Secondary | ICD-10-CM

## 2013-04-16 LAB — WET PREP FOR TRICH, YEAST, CLUE: Trich, Wet Prep: NONE SEEN

## 2013-04-16 MED ORDER — METRONIDAZOLE 0.75 % VA GEL: VAGINAL | Status: DC

## 2013-04-16 MED ORDER — FLUCONAZOLE 150 MG PO TABS: 150.0000 mg | ORAL_TABLET | Freq: Once | ORAL | Status: DC

## 2013-04-16 NOTE — Progress Notes (Signed)
Patient ID: Latoya Murphy, female   DOB: [birth year], 37 y.o.   MRN: [id number] Presents with complaint of vaginal dryness with Irritation. Yeast over-the-counter Monistat last week with some relief. [facility] 2012 for menorrhagia. Denies urinary symptoms, abdominal pain or fever.  Exam: Appears well, external genitalia slightly erythematous at introitus, speculum exam moderate amount of a white discharge noted, vaginal walls are erythematous, wet prep positive for clues, TNTC bacteria. Bimanual no adnexal fullness or tenderness.  Bacteria vaginosis  Plan: MetroGel vaginal cream 1 applicator at bedtime x5, alcohol precautions reviewed, Diflucan 150 by mouth times one dose. Instructed to call if no relief of symptoms.

## 2013-09-07 ENCOUNTER — Emergency Department (HOSPITAL_COMMUNITY)
Admission: EM | Admit: 2013-09-07 | Discharge: 2013-09-07 | Disposition: A | Discharge status: Home / Self Care | Payer: 59 | Source: Home / Self Care

## 2013-09-07 ENCOUNTER — Encounter (HOSPITAL_COMMUNITY): Payer: Self-pay | Admitting: Emergency Medicine

## 2013-09-07 DIAGNOSIS — R059 Cough, unspecified: Secondary | ICD-10-CM

## 2013-09-07 DIAGNOSIS — J069 Acute upper respiratory infection, unspecified: Secondary | ICD-10-CM

## 2013-09-07 DIAGNOSIS — J9801 Acute bronchospasm: Secondary | ICD-10-CM

## 2013-09-07 DIAGNOSIS — R05 Cough: Secondary | ICD-10-CM

## 2013-09-07 MED ORDER — ALBUTEROL SULFATE HFA 108 (90 BASE) MCG/ACT IN AERS: 1.0000 | INHALATION_SPRAY | Freq: Four times a day (QID) | RESPIRATORY_TRACT | Status: DC | PRN

## 2013-09-07 MED ORDER — GUAIFENESIN-CODEINE 100-10 MG/5ML PO SYRP: 5.0000 mL | ORAL_SOLUTION | Freq: Four times a day (QID) | ORAL | Status: DC | PRN

## 2013-09-07 NOTE — ED Provider Notes (Signed)
Medical screening examination/treatment/procedure(s) were performed by resident physician or non-physician practitioner and as supervising physician I was immediately available for consultation/collaboration.   Aleane Wesenberg DOUGLAS MD.   Brolin Dambrosia D Taiyo Kozma, MD 09/07/13 1426 

## 2013-09-07 NOTE — ED Provider Notes (Signed)
CSN: [id number]     Arrival date & time 09/07/13  0803 History   First MD Initiated Contact with Patient 09/07/13 [phone]     Chief Complaint  Patient presents with  . URI   (Consider location/radiation/quality/duration/timing/severity/associated sxs/prior Treatment) HPI Comments: Complaints of cold symptoms for a week. She took some OTC medications feel better for a while but now she has a cough that will not stop.    Past Medical History  Diagnosis Date  . Menorrhagia   . Normal delivery     x 3  . Migraines   . Chronic anemia IRON DEF.   Past Surgical History  Procedure Laterality Date  . Tubal ligation  2008  . Endometrial ablation  SEPT 2011    W/ HER OPTION ( IN MD OFFICE)  . Vaginal hysterectomy  08/15/2011    Procedure: HYSTERECTOMY VAGINAL;  Surgeon: Ok Edwards, MD;  Location: [facility];  Service: Gynecology;  Laterality: N/A;  TOTAL VAGINAL HYSTERECTOMY   Family History  Problem Relation Age of Onset  . Heart disease Father   . Hypertension Father   . Hypertension Brother   . Diabetes Maternal Grandmother    History  Substance Use Topics  . Smoking status: Former Smoker    Quit date: 08/12/2001  . Smokeless tobacco: Never Used  . Alcohol Use: No   OB History   Grav Para Term Preterm Abortions TAB SAB Ect Mult Living   3 3 3       3      Review of Systems  Constitutional: Negative for fever, chills, activity change, appetite change and fatigue.  HENT: Positive for congestion, postnasal drip and rhinorrhea. Negative for facial swelling.   Eyes: Negative.   Respiratory: Positive for cough. Negative for shortness of breath.        Sore ribs with cough  Cardiovascular: Negative.   Gastrointestinal: Negative.   Musculoskeletal: Negative for neck pain and neck stiffness.  Skin: Negative for pallor and rash.  Neurological: Negative.     Allergies  Penicillins; Ciprofloxacin; and Percocet  Home Medications   Current Outpatient Rx   Name  Route  Sig  Dispense  Refill  . ferrous sulfate 325 (65 FE) MG tablet   Oral   Take 325 mg by mouth daily with breakfast.         . SUMAtriptan (IMITREX) 50 MG tablet      TAKE 1 TABLET BY MOUTH EVERY 2 HOURS AS NEEDED FOR MIGRAINE.   10 tablet   5   . albuterol (PROVENTIL HFA;VENTOLIN HFA) 108 (90 BASE) MCG/ACT inhaler   Inhalation   Inhale 1-2 puffs into the lungs every 6 (six) hours as needed for wheezing or shortness of breath.   1 Inhaler   0   . fluconazole (DIFLUCAN) 150 MG tablet   Oral   Take 1 tablet (150 mg total) by mouth once.   1 tablet   1   . guaiFENesin-codeine (CHERATUSSIN AC) 100-10 MG/5ML syrup   Oral   Take 5 mLs by mouth 4 (four) times daily as needed for cough or congestion. 5 ml q 4h prn cough   120 mL   0   . metroNIDAZOLE (METROGEL VAGINAL) 0.75 % vaginal gel      1 applicator per vagina at HS x 5   70 g   0    BP 127/82  Pulse 91  Temp(Src) 99.6 F (37.6 C) (Oral)  Resp 14  SpO2 99%  LMP 06/09/2011 Physical Exam  Nursing note and vitals reviewed. Constitutional: She is oriented to person, place, and time. She appears well-developed and well-nourished. No distress.  HENT:  Right Ear: External ear normal.  Mouth/Throat: No oropharyngeal exudate.  OP with minor erythema, cobblestoning and clear PND   Eyes: Conjunctivae and EOM are normal.  Neck: Normal range of motion. Neck supple.  Cardiovascular: Normal rate and regular rhythm.   Pulmonary/Chest: Effort normal and breath sounds normal. No respiratory distress.  Faint and expiratory wheeze.  Musculoskeletal: Normal range of motion. She exhibits no edema.  Lymphadenopathy:    She has no cervical adenopathy.  Neurological: She is alert and oriented to person, place, and time.  Skin: Skin is warm and dry. No rash noted.  Psychiatric: She has a normal mood and affect.    ED Course  Procedures (including critical care time) Labs Review Labs Reviewed - No data to  display Imaging Review No results found.    MDM   1. URI (upper respiratory infection)   2. Cough   3. Bronchospasm      Cheratussin as directed for cough Allegra 180 mg daily for drainage Albuterol HFA 2 puffs every 4 hours when necessary cough and wheeze.  Hayden Rasmussen, NP 09/07/13 [phone]

## 2013-09-07 NOTE — ED Notes (Signed)
C/o has had URI type syx x 3 since transferred to ED a couple of months ago. Coughed all night last night at work, sore in ribs; NAD at present

## 2013-11-03 ENCOUNTER — Ambulatory Visit (INDEPENDENT_AMBULATORY_CARE_PROVIDER_SITE_OTHER): Payer: 59 | Admitting: Gynecology

## 2013-11-03 ENCOUNTER — Encounter: Payer: Self-pay | Admitting: Gynecology

## 2013-11-03 DIAGNOSIS — N898 Other specified noninflammatory disorders of vagina: Secondary | ICD-10-CM

## 2013-11-03 DIAGNOSIS — N949 Unspecified condition associated with female genital organs and menstrual cycle: Secondary | ICD-10-CM

## 2013-11-03 DIAGNOSIS — Z113 Encounter for screening for infections with a predominantly sexual mode of transmission: Secondary | ICD-10-CM

## 2013-11-03 DIAGNOSIS — B379 Candidiasis, unspecified: Secondary | ICD-10-CM

## 2013-11-03 LAB — WET PREP FOR TRICH, YEAST, CLUE
Clue Cells Wet Prep HPF POC: NONE SEEN
TRICH WET PREP: NONE SEEN
WBC, Wet Prep HPF POC: NONE SEEN

## 2013-11-03 MED ORDER — FLUCONAZOLE 150 MG PO TABS: 150.0000 mg | ORAL_TABLET | Freq: Once | ORAL | Status: DC

## 2013-11-03 NOTE — Patient Instructions (Signed)
Monilial Vaginitis  Vaginitis in a soreness, swelling and redness (inflammation) of the vagina and vulva. Monilial vaginitis is not a sexually transmitted infection.  CAUSES   Yeast vaginitis is caused by yeast (candida) that is normally found in your vagina. With a yeast infection, the candida has overgrown in number to a point that upsets the chemical balance.  SYMPTOMS   · White, thick vaginal discharge.  · Swelling, itching, redness and irritation of the vagina and possibly the lips of the vagina (vulva).  · Burning or painful urination.  · Painful intercourse.  DIAGNOSIS   Things that may contribute to monilial vaginitis are:  · Postmenopausal and virginal states.  · Pregnancy.  · Infections.  · Being tired, sick or stressed, especially if you had monilial vaginitis in the past.  · Diabetes. Good control will help lower the chance.  · Birth control pills.  · Tight fitting garments.  · Using bubble bath, feminine sprays, douches or deodorant tampons.  · Taking certain medications that kill germs (antibiotics).  · Sporadic recurrence can occur if you become ill.  TREATMENT   Your caregiver will give you medication.  · There are several kinds of anti monilial vaginal creams and suppositories specific for monilial vaginitis. For recurrent yeast infections, use a suppository or cream in the vagina 2 times a week, or as directed.  · Anti-monilial or steroid cream for the itching or irritation of the vulva may also be used. Get your caregiver's permission.  · Painting the vagina with methylene blue solution may help if the monilial cream does not work.  · Eating yogurt may help prevent monilial vaginitis.  HOME CARE INSTRUCTIONS   · Finish all medication as prescribed.  · Do not have sex until treatment is completed or after your caregiver tells you it is okay.  · Take warm sitz baths.  · Do not douche.  · Do not use tampons, especially scented ones.  · Wear cotton underwear.  · Avoid tight pants and panty  hose.  · Tell your sexual partner that you have a yeast infection. They should go to their caregiver if they have symptoms such as mild rash or itching.  · Your sexual partner should be treated as well if your infection is difficult to eliminate.  · Practice safer sex. Use condoms.  · Some vaginal medications cause latex condoms to fail. Vaginal medications that harm condoms are:  · Cleocin cream.  · Butoconazole (Femstat®).  · Terconazole (Terazol®) vaginal suppository.  · Miconazole (Monistat®) (may be purchased over the counter).  SEEK MEDICAL CARE IF:   · You have a temperature by mouth above 102° F (38.9° C).  · The infection is getting worse after 2 days of treatment.  · The infection is not getting better after 3 days of treatment.  · You develop blisters in or around your vagina.  · You develop vaginal bleeding, and it is not your menstrual period.  · You have pain when you urinate.  · You develop intestinal problems.  · You have pain with sexual intercourse.  Document Released: 06/12/2005 Document Revised: 11/25/2011 Document Reviewed: 02/24/2009  [employer]® Patient Information ©2014 [employer].

## 2013-11-03 NOTE — Progress Notes (Signed)
   Patient is a 38 year old who presented to the office today complaining of a vaginal discharge with odor for the past week and a half. Patient had used some over-the-counter antifungal agent for a few days. She has informed me that she was separated for her husband for over a year and recently got together. They both have had other partners before they got together. Patient with prior hysterectomy.  Exam: Bartholin urethra Skene within normal limits Vagina: Gel-like material was found in the vagina. Vaginal cuff intact. A GC and chlamydia culture was obtained.  A wet prep: Yeast  Assessment/plan: Yeast vulvovaginitis will be treated with Diflucan 150 mg one by mouth today. Results are GC a culture pending at time of this dictation.

## 2013-11-04 LAB — GC/CHLAMYDIA PROBE AMP
CT Probe RNA: NEGATIVE
GC Probe RNA: NEGATIVE

## 2014-02-10 DIAGNOSIS — Z0289 Encounter for other administrative examinations: Secondary | ICD-10-CM

## 2014-02-11 ENCOUNTER — Encounter: Payer: 59 | Admitting: Gynecology

## 2014-02-14 ENCOUNTER — Encounter: Payer: 59 | Admitting: Gynecology

## 2014-03-02 ENCOUNTER — Ambulatory Visit (INDEPENDENT_AMBULATORY_CARE_PROVIDER_SITE_OTHER): Payer: 59 | Admitting: Gynecology

## 2014-03-02 ENCOUNTER — Encounter: Payer: Self-pay | Admitting: Gynecology

## 2014-03-02 ENCOUNTER — Other Ambulatory Visit (HOSPITAL_COMMUNITY)
Admission: RE | Admit: 2014-03-02 | Discharge: 2014-03-02 | Disposition: A | Discharge status: Home / Self Care | Payer: 59 | Source: Ambulatory Visit | Attending: Gynecology | Admitting: Gynecology

## 2014-03-02 VITALS — BP 134/86 | Ht 63.0 in | Wt 197.0 lb

## 2014-03-02 DIAGNOSIS — Z01419 Encounter for gynecological examination (general) (routine) without abnormal findings: Secondary | ICD-10-CM

## 2014-03-02 DIAGNOSIS — Z1151 Encounter for screening for human papillomavirus (HPV): Secondary | ICD-10-CM | POA: Insufficient documentation

## 2014-03-02 LAB — CBC WITH DIFFERENTIAL/PLATELET
BASOS PCT: 0 % (ref 0–1)
Basophils Absolute: 0 10*3/uL (ref 0.0–0.1)
EOS ABS: 0.2 10*3/uL (ref 0.0–0.7)
Eosinophils Relative: 2 % (ref 0–5)
HCT: 34.9 % — ABNORMAL LOW (ref 36.0–46.0)
HEMOGLOBIN: 11.9 g/dL — AB (ref 12.0–15.0)
LYMPHS ABS: 2.7 10*3/uL (ref 0.7–4.0)
Lymphocytes Relative: 36 % (ref 12–46)
MCH: 26.6 pg (ref 26.0–34.0)
MCHC: 34.1 g/dL (ref 30.0–36.0)
MCV: 78.1 fL (ref 78.0–100.0)
MONO ABS: 0.6 10*3/uL (ref 0.1–1.0)
MONOS PCT: 8 % (ref 3–12)
Neutro Abs: 4.1 10*3/uL (ref 1.7–7.7)
Neutrophils Relative %: 54 % (ref 43–77)
Platelets: 230 10*3/uL (ref 150–400)
RBC: 4.47 MIL/uL (ref 3.87–5.11)
RDW: 14.2 % (ref 11.5–15.5)
WBC: 7.5 10*3/uL (ref 4.0–10.5)

## 2014-03-02 LAB — LIPID PANEL
CHOL/HDL RATIO: 3 ratio
CHOLESTEROL: 137 mg/dL (ref 0–200)
HDL: 45 mg/dL (ref >39)
LDL CALC: 80 mg/dL (ref 0–99)
TRIGLYCERIDES: 60 mg/dL (ref <150)
VLDL: 12 mg/dL (ref 0–40)

## 2014-03-02 LAB — COMPREHENSIVE METABOLIC PANEL
ALT: 10 U/L (ref 0–35)
AST: 21 U/L (ref 0–37)
Albumin: 4.1 g/dL (ref 3.5–5.2)
Alkaline Phosphatase: 63 U/L (ref 39–117)
BUN: 15 mg/dL (ref 6–23)
CALCIUM: 8.8 mg/dL (ref 8.4–10.5)
CHLORIDE: 106 meq/L (ref 96–112)
CO2: 22 mEq/L (ref 19–32)
Creat: 0.84 mg/dL (ref 0.50–1.10)
Glucose, Bld: 89 mg/dL (ref 70–99)
Potassium: 3.7 mEq/L (ref 3.5–5.3)
Sodium: 136 mEq/L (ref 135–145)
Total Bilirubin: 0.3 mg/dL (ref 0.2–1.2)
Total Protein: 7 g/dL (ref 6.0–8.3)

## 2014-03-02 LAB — TSH: TSH: 1.784 u[IU]/mL (ref 0.350–4.500)

## 2014-03-02 MED ORDER — SUMATRIPTAN SUCCINATE 50 MG PO TABS: 50.0000 mg | ORAL_TABLET | Freq: Once | ORAL | Status: DC

## 2014-03-02 NOTE — Progress Notes (Signed)
Latoya Murphy 03-03-1976 [id number]   History:    38 y.o.  for annual gyn exam with no complaints today with exception of weight gain. Review of patient's records indicated that she had a TVH back in 2012 and has done well since her surgery. 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 Pathology report from [facility] demonstrated normal cervical pathology.  Past medical history,surgical history, family history and social history were all reviewed and documented in the EPIC chart.  Gynecologic History Patient's last menstrual period was 06/09/2011. Contraception: status post hysterectomy Last Pap: 2012. Results were: normal Last mammogram: Not indicated. Results were: Not indicated  Obstetric History OB History  Gravida Para Term Preterm AB SAB TAB Ectopic Multiple Living  3 3 3       3     # Outcome Date GA Lbr Len/2nd Weight Sex Delivery Anes PTL Lv  3 TRM      SVD   Y  2 TRM      SVD   Y  1 TRM      SVD   Y       ROS: A ROS was performed and pertinent positives and negatives are included in the history.  GENERAL: No fevers or chills. HEENT: No change in vision, no earache, sore throat or sinus congestion. NECK: No pain or stiffness. CARDIOVASCULAR: No chest pain or pressure. No palpitations. PULMONARY: No shortness of breath, cough or wheeze. GASTROINTESTINAL: No abdominal pain, nausea, vomiting or diarrhea, melena or bright red blood per rectum. GENITOURINARY: No urinary frequency, urgency, hesitancy or dysuria. MUSCULOSKELETAL: No joint or muscle pain, no back pain, no recent trauma. DERMATOLOGIC: No rash, no itching, no lesions. ENDOCRINE: No polyuria, polydipsia, no heat or cold intolerance. No recent change in weight. HEMATOLOGICAL: No anemia or easy bruising or bleeding. NEUROLOGIC: No headache, seizures, numbness, tingling or weakness. PSYCHIATRIC: No depression, no loss of interest in normal activity or change in sleep  pattern.     Exam: chaperone present  BP 134/86  Ht 5\' 3"  (1.6 m)  Wt 197 lb (89.359 kg)  BMI 34.91 kg/m2  LMP 06/09/2011  Body mass index is 34.91 kg/(m^2).  General appearance : Well developed well nourished female. No acute distress HEENT: Neck supple, trachea midline, no carotid bruits, no thyroidmegaly Lungs: Clear to auscultation, no rhonchi or wheezes, or rib retractions  Heart: Regular rate and rhythm, no murmurs or gallops Breast:Examined in sitting and supine position were symmetrical in appearance, no palpable masses or tenderness,  no skin retraction, no nipple inversion, no nipple discharge, no skin discoloration, no axillary or supraclavicular lymphadenopathy Abdomen: no palpable masses or tenderness, no rebound or guarding Extremities: no edema or skin discoloration or tenderness  Pelvic:  Bartholin, Urethra, Skene Glands: Within normal limits             Vagina: No gross lesions or discharge  Cervix: Absent  Uterus absent  Adnexa  Without masses or tenderness  Anus and perineum  normal   Rectovaginal  normal sphincter tone without palpated masses or tenderness             Hemoccult not indicated     Assessment/Plan:  38 y.o. female for annual exam who has done well with the exception of weight gain. We discussed cholesterol-lowering diet as well as regular exercise. Patient is on a swing shift in the ED which makes it difficult for her to be healthy and at  the right times. She was instructed to continue to do her monthly breast exam. Pap smear was done today. The following labs were ordered: CBC, fasting lipid profile, comprehensive metabolic panel and TSH along with urinalysis.  Note: This dictation was prepared with  Dragon/digital dictation along withSmart phrase technology. Any transcriptional errors that result from this process are unintentional.   Terrance Mass MD, 11:11 AM 03/02/2014

## 2014-03-02 NOTE — Addendum Note (Signed)
Addended by: Thurnell Garbe A on: 03/02/2014 11:16 AM   Modules accepted: Orders

## 2014-03-02 NOTE — Patient Instructions (Signed)
Exercise to Lose Weight Exercise and a healthy diet may help you lose weight. Your doctor may suggest specific exercises. EXERCISE IDEAS AND TIPS  Choose low-cost things you enjoy doing, such as walking, bicycling, or exercising to workout videos.   Take stairs instead of the elevator.   Walk during your lunch break.   Park your car further away from work or school.   Go to a gym or an exercise class.   Start with 5 to 10 minutes of exercise each day. Build up to 30 minutes of exercise 4 to 6 days a week.   Wear shoes with good support and comfortable clothes.   Stretch before and after working out.   Work out until you breathe harder and your heart beats faster.   Drink extra water when you exercise.   Do not do so much that you hurt yourself, feel dizzy, or get very short of breath.  Exercises that burn about 150 calories:  Running 1  miles in 15 minutes.   Playing volleyball for 45 to 60 minutes.   Washing and waxing a car for 45 to 60 minutes.   Playing touch football for 45 minutes.   Walking 1  miles in 35 minutes.   Pushing a stroller 1  miles in 30 minutes.   Playing basketball for 30 minutes.   Raking leaves for 30 minutes.   Bicycling 5 miles in 30 minutes.   Walking 2 miles in 30 minutes.   Dancing for 30 minutes.   Shoveling snow for 15 minutes.   Swimming laps for 20 minutes.   Walking up stairs for 15 minutes.   Bicycling 4 miles in 15 minutes.   Gardening for 30 to 45 minutes.   Jumping rope for 15 minutes.   Washing windows or floors for 45 to 60 minutes.  Document Released: 10/05/2010 Document Revised: 05/15/2011 Document Reviewed: 10/05/2010 [employer] Patient Information 2012 [employer].                                          Patient information: High cholesterol (The Basics)  What is cholesterol? - Cholesterol is a substance that is found in the blood. Everyone has some. It is needed for good health. The problem is, people  sometimes have too much cholesterol. Compared with people with normal cholesterol, people with high cholesterol have a higher risk of heart attacks, strokes, and other health problems. The higher your cholesterol, the higher your risk of these problems. Cholesterol levels in your body are determined significantly by your diet. Cholesterol levels may also be related to heart disease. The following material helps to explain this relationship and discusses what you can do to help keep your heart healthy. Not all cholesterol is bad. Low-density lipoprotein (LDL) cholesterol is the "bad" cholesterol. It may cause fatty deposits to build up inside your arteries. High-density lipoprotein (HDL) cholesterol is "good." It helps to remove the "bad" LDL cholesterol from your blood. Cholesterol is a very important risk factor for heart disease. Other risk factors are high blood pressure, smoking, stress, heredity, and weight.  The heart muscle gets its supply of blood through the coronary arteries. If your LDL cholesterol is high and your HDL cholesterol is low, you are at risk for having fatty deposits build up in your coronary arteries. This leaves less room through which blood can flow. Without sufficient blood and   oxygen, the heart muscle cannot function properly and you may feel chest pains (angina pectoris). When a coronary artery closes up entirely, a part of the heart muscle may die, causing a heart attack (myocardial infarction).  CHECKING CHOLESTEROL When your caregiver sends your blood to a lab to be analyzed for cholesterol, a complete lipid (fat) profile may be done. With this test, the total amount of cholesterol and levels of LDL and HDL are determined. Triglycerides are a type of fat that circulates in the blood and can also be used to determine heart disease risk. Are there different types of cholesterol? - Yes, there are a few different types. If you get a cholesterol test, you may hear your doctor or  nurse talk about: Total cholesterol  LDL cholesterol - Some people call this the "bad" cholesterol. That's because having high LDL levels raises your risk of heart attacks, strokes, and other health problems.  HDL cholesterol - Some people call this the "good" cholesterol. That's because having high HDL levels lowers your risk of heart attacks, strokes, and other health problems.  Non-HDL cholesterol - Non-HDL cholesterol is your total cholesterol minus your HDL cholesterol.  Triglycerides - Triglycerides are not cholesterol. They are a type of fat. But they often get measured when cholesterol is measured. (Having high triglycerides also seems to increase the risk of heart attacks and strokes.)   Keep in mind, though, that many people who cannot meet these goals still have a low risk of heart attacks and strokes. What should I do if my doctor tells me I have high cholesterol? - Ask your doctor what your overall risk of heart attacks and strokes is. High cholesterol, by itself, is not always a reason to worry. Having high cholesterol is just one of many things that can increase your risk of heart attacks and strokes. Other factors that increase your risk include:  Cigarette smoking  High blood pressure  Having a parent, sister, or brother who got heart disease at a young age (Young, in this case, means younger than 55 for men and younger than 65 for women.)  Being a man (Women are at risk, too, but men have a higher risk.)  Older age  If you are at high risk of heart attacks and strokes, having high cholesterol is a problem. On the other hand, if you have are at low risk, having high cholesterol may not mean much. Should I take medicine to lower cholesterol? - Not everyone who has high cholesterol needs medicines. Your doctor or nurse will decide if you need them based on your age, family history, and other health concerns.  You should probably take a cholesterol-lowering medicine called a statin if  you: Already had a heart attack or stroke  Have known heart disease  Have diabetes  Have a condition called peripheral artery disease, which makes it painful to walk, and happens when the arteries in your legs get clogged with fatty deposits  Have an abdominal aortic aneurysm, which is a widening of the main artery in the belly  Most people with any of the conditions listed above should take a statin no matter what their cholesterol level is. If your doctor or nurse puts you on a statin, stay on it. The medicine may not make you feel any different. But it can help prevent heart attacks, strokes, and death.  Can I lower my cholesterol without medicines? - Yes, you can lower your cholesterol some by:  Avoiding red meat, butter,   fried foods, cheese, and other foods that have a lot of saturated fat  Losing weight (if you are overweight)  Being more active Even if these steps do little to change your cholesterol, they can improve your health in many ways.                                                   Cholesterol Control Diet  CONTROLLING CHOLESTEROL WITH DIET Although exercise and lifestyle factors are important, your diet is key. That is because certain foods are known to raise cholesterol and others to lower it. The goal is to balance foods for their effect on cholesterol and more importantly, to replace saturated and trans fat with other types of fat, such as monounsaturated fat, polyunsaturated fat, and omega-3 fatty acids. On average, a person should consume no more than 15 to 17 g of saturated fat daily. Saturated and trans fats are considered "bad" fats, and they will raise LDL cholesterol. Saturated fats are primarily found in animal products such as meats, butter, and cream. However, that does not mean you need to sacrifice all your favorite foods. Today, there are good tasting, low-fat, low-cholesterol substitutes for most of the things you like to eat. Choose low-fat or nonfat  alternatives. Choose round or loin cuts of red meat, since these types of cuts are lowest in fat and cholesterol. Chicken (without the skin), fish, veal, and ground turkey breast are excellent choices. Eliminate fatty meats, such as hot dogs and salami. Even shellfish have little or no saturated fat. Have a 3 oz (85 g) portion when you eat lean meat, poultry, or fish. Trans fats are also called "partially hydrogenated oils." They are oils that have been scientifically manipulated so that they are solid at room temperature resulting in a longer shelf life and improved taste and texture of foods in which they are added. Trans fats are found in stick margarine, some tub margarines, cookies, crackers, and baked goods.  When baking and cooking, oils are an excellent substitute for butter. The monounsaturated oils are especially beneficial since it is believed they lower LDL and raise HDL. The oils you should avoid entirely are saturated tropical oils, such as coconut and palm.  Remember to eat liberally from food groups that are naturally free of saturated and trans fat, including fish, fruit, vegetables, beans, grains (barley, rice, couscous, bulgur wheat), and pasta (without cream sauces).  IDENTIFYING FOODS THAT LOWER CHOLESTEROL  Soluble fiber may lower your cholesterol. This type of fiber is found in fruits such as apples, vegetables such as broccoli, potatoes, and carrots, legumes such as beans, peas, and lentils, and grains such as barley. Foods fortified with plant sterols (phytosterol) may also lower cholesterol. You should eat at least 2 g per day of these foods for a cholesterol lowering effect.  Read package labels to identify low-saturated fats, trans fats free, and low-fat foods at the supermarket. Select cheeses that have only 2 to 3 g saturated fat per ounce. Use a heart-healthy tub margarine that is free of trans fats or partially hydrogenated oil. When buying baked goods (cookies, crackers), avoid  partially hydrogenated oils. Breads and muffins should be made from whole grains (whole-wheat or whole oat flour, instead of "flour" or "enriched flour"). Buy non-creamy canned soups with reduced salt and no added fats.  FOOD PREPARATION TECHNIQUES    Never deep-fry. If you must fry, either stir-fry, which uses very little fat, or use non-stick cooking sprays. When possible, broil, bake, or roast meats, and steam vegetables. Instead of dressing vegetables with butter or margarine, use lemon and herbs, applesauce and cinnamon (for squash and sweet potatoes), nonfat yogurt, salsa, and low-fat dressings for salads.  LOW-SATURATED FAT / LOW-FAT FOOD SUBSTITUTES Meats / Saturated Fat (g)  Avoid: Steak, marbled (3 oz/85 g) / 11 g   Choose: Steak, lean (3 oz/85 g) / 4 g   Avoid: Hamburger (3 oz/85 g) / 7 g   Choose: Hamburger, lean (3 oz/85 g) / 5 g   Avoid: Ham (3 oz/85 g) / 6 g   Choose: Ham, lean cut (3 oz/85 g) / 2.4 g   Avoid: Chicken, with skin, dark meat (3 oz/85 g) / 4 g   Choose: Chicken, skin removed, dark meat (3 oz/85 g) / 2 g   Avoid: Chicken, with skin, light meat (3 oz/85 g) / 2.5 g   Choose: Chicken, skin removed, light meat (3 oz/85 g) / 1 g  Dairy / Saturated Fat (g)  Avoid: Whole milk (1 cup) / 5 g   Choose: Low-fat milk, 2% (1 cup) / 3 g   Choose: Low-fat milk, 1% (1 cup) / 1.5 g   Choose: Skim milk (1 cup) / 0.3 g   Avoid: Hard cheese (1 oz/28 g) / 6 g   Choose: Skim milk cheese (1 oz/28 g) / 2 to 3 g   Avoid: Cottage cheese, 4% fat (1 cup) / 6.5 g   Choose: Low-fat cottage cheese, 1% fat (1 cup) / 1.5 g   Avoid: Ice cream (1 cup) / 9 g   Choose: Sherbet (1 cup) / 2.5 g   Choose: Nonfat frozen yogurt (1 cup) / 0.3 g   Choose: Frozen fruit bar / trace   Avoid: Whipped cream (1 tbs) / 3.5 g   Choose: Nondairy whipped topping (1 tbs) / 1 g  Condiments / Saturated Fat (g)  Avoid: Mayonnaise (1 tbs) / 2 g   Choose: Low-fat mayonnaise (1 tbs) / 1 g    Avoid: Butter (1 tbs) / 7 g   Choose: Extra light margarine (1 tbs) / 1 g   Avoid: Coconut oil (1 tbs) / 11.8 g   Choose: Olive oil (1 tbs) / 1.8 g   Choose: Corn oil (1 tbs) / 1.7 g   Choose: Safflower oil (1 tbs) / 1.2 g   Choose: Sunflower oil (1 tbs) / 1.4 g   Choose: Soybean oil (1 tbs) / 2.4 g   Choose: Canola oil (1 tbs) / 1 g   

## 2014-03-03 ENCOUNTER — Other Ambulatory Visit: Payer: Self-pay | Admitting: Gynecology

## 2014-03-03 DIAGNOSIS — D500 Iron deficiency anemia secondary to blood loss (chronic): Secondary | ICD-10-CM

## 2014-03-03 LAB — URINALYSIS W MICROSCOPIC + REFLEX CULTURE
BACTERIA UA: NONE SEEN
BILIRUBIN URINE: NEGATIVE
CASTS: NONE SEEN
CRYSTALS: NONE SEEN
Glucose, UA: NEGATIVE mg/dL
Hgb urine dipstick: NEGATIVE
KETONES UR: NEGATIVE mg/dL
Leukocytes, UA: NEGATIVE
Nitrite: NEGATIVE
PH: 6.5 (ref 5.0–8.0)
Protein, ur: NEGATIVE mg/dL
SQUAMOUS EPITHELIAL / LPF: NONE SEEN
Specific Gravity, Urine: 1.014 (ref 1.005–1.030)
Urobilinogen, UA: 1 mg/dL (ref 0.0–1.0)

## 2014-03-04 LAB — CYTOLOGY - PAP

## 2014-07-18 ENCOUNTER — Encounter: Payer: Self-pay | Admitting: Gynecology

## 2015-01-12 ENCOUNTER — Ambulatory Visit (INDEPENDENT_AMBULATORY_CARE_PROVIDER_SITE_OTHER): Payer: 59 | Admitting: Gynecology

## 2015-01-12 ENCOUNTER — Encounter: Payer: Self-pay | Admitting: Gynecology

## 2015-01-12 VITALS — BP 124/86

## 2015-01-12 DIAGNOSIS — R42 Dizziness and giddiness: Secondary | ICD-10-CM | POA: Diagnosis not present

## 2015-01-12 LAB — COMPREHENSIVE METABOLIC PANEL
ALT: 10 U/L (ref 0–35)
AST: 21 U/L (ref 0–37)
Albumin: 4.1 g/dL (ref 3.5–5.2)
Alkaline Phosphatase: 55 U/L (ref 39–117)
BILIRUBIN TOTAL: 0.5 mg/dL (ref 0.2–1.2)
BUN: 10 mg/dL (ref 6–23)
CO2: 26 meq/L (ref 19–32)
CREATININE: 0.78 mg/dL (ref 0.50–1.10)
Calcium: 9.1 mg/dL (ref 8.4–10.5)
Chloride: 108 mEq/L (ref 96–112)
GLUCOSE: 91 mg/dL (ref 70–99)
Potassium: 4.6 mEq/L (ref 3.5–5.3)
Sodium: 140 mEq/L (ref 135–145)
Total Protein: 6.4 g/dL (ref 6.0–8.3)

## 2015-01-12 LAB — CBC WITH DIFFERENTIAL/PLATELET
BASOS PCT: 1 % (ref 0–1)
Basophils Absolute: 0 10*3/uL (ref 0.0–0.1)
EOS ABS: 0.1 10*3/uL (ref 0.0–0.7)
EOS PCT: 2 % (ref 0–5)
HCT: 35.9 % — ABNORMAL LOW (ref 36.0–46.0)
HEMOGLOBIN: 11.8 g/dL — AB (ref 12.0–15.0)
LYMPHS ABS: 2.1 10*3/uL (ref 0.7–4.0)
Lymphocytes Relative: 42 % (ref 12–46)
MCH: 27.1 pg (ref 26.0–34.0)
MCHC: 32.9 g/dL (ref 30.0–36.0)
MCV: 82.5 fL (ref 78.0–100.0)
MONO ABS: 0.3 10*3/uL (ref 0.1–1.0)
MPV: 9.7 fL (ref 8.6–12.4)
Monocytes Relative: 7 % (ref 3–12)
Neutro Abs: 2.4 10*3/uL (ref 1.7–7.7)
Neutrophils Relative %: 48 % (ref 43–77)
PLATELETS: 258 10*3/uL (ref 150–400)
RBC: 4.35 MIL/uL (ref 3.87–5.11)
RDW: 13.8 % (ref 11.5–15.5)
WBC: 4.9 10*3/uL (ref 4.0–10.5)

## 2015-01-12 LAB — HEMOGLOBIN A1C
HEMOGLOBIN A1C: 5.9 % — AB (ref <5.7)
Mean Plasma Glucose: 123 mg/dL — ABNORMAL HIGH (ref <117)

## 2015-01-12 LAB — TSH: TSH: 1.098 u[IU]/mL (ref 0.350–4.500)

## 2015-01-12 NOTE — Progress Notes (Signed)
   Patient is a 39 year old who presented to the office today stating that over the course of the past 2-3 months she's been complaining of dizzy spells. 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 Pathology report from [facility] demonstrated normal cervical pathology. Patient denies any use of illicit drugs or alcohol. She denies any loss of balance or gait disturbance. She denies any visual or auditory problems or any change in her taste buds. Any headaches. She denies any family history of any neurological problems or any history of diabetes. Patient's lab work was done last year time of her annual exam and the only abnormality was a mild anemia with hemoglobin 11.9.  Exam: Blood pressure 124/86 Appearance: Well developed well nourished female in no acute distress HEENT unremarkable/no carotid bruits or thyromegaly Lungs: Clear to auscultation Rogers or wheezes Heart: Regular rate and rhythm no murmurs or gallops   A Dix-Halpike maneuver was performed and no nystagmus was noted there was no reproducibility of the symptoms that patient had been complaining the past few months.  Assessment/plan: Etiology of dizziness questionable vertigo although not reproduced on examination. We discussed placing her on a few weeks of Antivert and monitor symptoms and referred to neurologists of persist. Patient states that very infrequent and would like to monitor. 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 She stated she  maintains good hydration. We will check a CBC to make sure she is not anemic also check a thyroid function test as well as a conference metabolic panel and hemoglobin A1c. If her symptoms persist I've given her the name and number of one of my neurology colleagues.

## 2015-01-12 NOTE — Patient Instructions (Addendum)
Vertigo °Vertigo means you feel like you or your surroundings are moving when they are not. Vertigo can be dangerous if it occurs when you are at work, driving, or performing difficult activities.  °CAUSES  °Vertigo occurs when there is a conflict of signals sent to your brain from the visual and sensory systems in your body. There are many different causes of vertigo, including: °· Infections, especially in the inner ear. °· A bad reaction to a drug or misuse of alcohol and medicines. °· Withdrawal from drugs or alcohol. °· Rapidly changing positions, such as lying down or rolling over in bed. °· A migraine headache. °· Decreased blood flow to the brain. °· Increased pressure in the brain from a head injury, infection, tumor, or bleeding. °SYMPTOMS  °You may feel as though the world is spinning around or you are falling to the ground. Because your balance is upset, vertigo can cause nausea and vomiting. You may have involuntary eye movements (nystagmus). °DIAGNOSIS  °Vertigo is usually diagnosed by physical exam. If the cause of your vertigo is unknown, your caregiver may perform imaging tests, such as an MRI scan (magnetic resonance imaging). °TREATMENT  °Most cases of vertigo resolve on their own, without treatment. Depending on the cause, your caregiver may prescribe certain medicines. If your vertigo is related to body position issues, your caregiver may recommend movements or procedures to correct the problem. In rare cases, if your vertigo is caused by certain inner ear problems, you may need surgery. °HOME CARE INSTRUCTIONS  °· Follow your caregiver's instructions. °· Avoid driving. °· Avoid operating heavy machinery. °· Avoid performing any tasks that would be dangerous to you or others during a vertigo episode. °· Tell your caregiver if you notice that certain medicines seem to be causing your vertigo. Some of the medicines used to treat vertigo episodes can actually make them worse in some people. °SEEK  IMMEDIATE MEDICAL CARE IF:  °· Your medicines do not relieve your vertigo or are making it worse. °· You develop problems with talking, walking, weakness, or using your arms, hands, or legs. °· You develop severe headaches. °· Your nausea or vomiting continues or gets worse. °· You develop visual changes. °· A family member notices behavioral changes. °· Your condition gets worse. °MAKE SURE YOU: °· Understand these instructions. °· Will watch your condition. °· Will get help right away if you are not doing well or get worse. °Document Released: 06/12/2005 Document Revised: 11/25/2011 Document Reviewed: 03/21/2011 °Exit[employer]® Patient Information ©2015 [employer]. This information is not intended to replace advice given to you by your health care provider. Make sure you discuss any questions you have with your health care provider. ° °Meclizine tablets or capsules °What is this medicine? °MECLIZINE (MEK li zeen) is an antihistamine. It is used to prevent nausea, vomiting, or dizziness caused by motion sickness. It is also used to prevent and treat vertigo (extreme dizziness or a feeling that you or your surroundings are tilting or spinning around). °This medicine may be used for other purposes; ask your health care provider or pharmacist if you have questions. °COMMON BRAND NAME(S): Antivert, Dramamine Less Drowsy, Medivert, Meni-D °What should I tell my health care provider before I take this medicine? °They need to know if you have any of these conditions: °-asthma °-glaucoma °-prostate trouble °-stomach problems °-urinary problems °-an unusual or allergic reaction to meclizine, other medicines, foods, dyes, or preservatives °-pregnant or trying to get pregnant °-breast-feeding °How should I use this medicine? °  Take this medicine by mouth with a glass of water. Follow the directions on the prescription label. If you are using this medicine to prevent motion sickness, take the dose at least 1 hour before travel.  If it upsets your stomach, take it with food or milk. Take your doses at regular intervals. Do not take your medicine more often than directed. °Talk to your pediatrician regarding the use of this medicine in children. Special care may be needed. °Overdosage: If you think you have taken too much of this medicine contact a poison control center or emergency room at once. °NOTE: This medicine is only for you. Do not share this medicine with others. °What if I miss a dose? °If you miss a dose, take it as soon as you can. If it is almost time for your next dose, take only that dose. Do not take double or extra doses. °What may interact with this medicine? °-barbiturate medicines for inducing sleep or treating seizures °-digoxin °-medicines for anxiety or sleeping problems, like alprazolam, diazepam or temazepam °-medicines for hay fever and other allergies °-medicines for mental depression °-medicines for movement abnormalities as in Parkinson's disease, or for stomach problems °-medicines for pain °-medicines that relax muscles °This list may not describe all possible interactions. Give your health care provider a list of all the medicines, herbs, non-prescription drugs, or dietary supplements you use. Also tell them if you smoke, drink alcohol, or use illegal drugs. Some items may interact with your medicine. °What should I watch for while using this medicine? °If you are taking this medicine on a regular schedule, visit your doctor or health care professional for regular checks on your progress. °You may get dizzy, drowsy or have blurred vision. Do not drive, use machinery, or do anything that needs mental alertness until you know how this medicine affects you. Do not stand or sit up quickly, especially if you are an older patient. This reduces the risk of dizzy or fainting spells. Alcohol can increase possible dizziness. Avoid alcoholic drinks. °Your mouth may get dry. Chewing sugarless gum or sucking hard candy,  and drinking plenty of water may help. Contact your doctor if the problem does not go away or is severe. °This medicine may cause dry eyes and blurred vision. If you wear contact lenses you may feel some discomfort. Lubricating drops may help. See your eye doctor if the problem does not go away or is severe. °What side effects may I notice from receiving this medicine? °Side effects that you should report to your doctor or health care professional as soon as possible: °-fainting spells °-fast or irregular heartbeat °Side effects that usually do not require medical attention (report to your doctor or health care professional if they continue or are bothersome): °-constipation °-difficulty passing urine °-difficulty sleeping °-headache °-stomach upset °This list may not describe all possible side effects. Call your doctor for medical advice about side effects. You may report side effects to FDA at 1-800-FDA-1088. °Where should I keep my medicine? °Keep out of the reach of children. °Store at room temperature between 15 and 30 degrees C (59 and 86 degrees F). Keep container tightly closed. Throw away any unused medicine after the expiration date. °NOTE: This sheet is a summary. It may not cover all possible information. If you have questions about this medicine, talk to your doctor, pharmacist, or health care provider. °© 2015, [employer]. (2008-03-10 10:35:36) ° °

## 2015-01-13 ENCOUNTER — Other Ambulatory Visit: Payer: Self-pay | Admitting: Gynecology

## 2015-01-13 DIAGNOSIS — R7309 Other abnormal glucose: Secondary | ICD-10-CM

## 2015-03-07 ENCOUNTER — Encounter: Payer: 59 | Admitting: Gynecology

## 2015-03-14 ENCOUNTER — Encounter: Payer: Self-pay | Admitting: Gynecology

## 2015-03-14 ENCOUNTER — Ambulatory Visit (INDEPENDENT_AMBULATORY_CARE_PROVIDER_SITE_OTHER): Payer: 59 | Admitting: Gynecology

## 2015-03-14 ENCOUNTER — Encounter: Payer: 59 | Admitting: Gynecology

## 2015-03-14 VITALS — BP 124/86 | Ht 63.0 in | Wt 199.0 lb

## 2015-03-14 DIAGNOSIS — R1031 Right lower quadrant pain: Secondary | ICD-10-CM | POA: Diagnosis not present

## 2015-03-14 DIAGNOSIS — Z01419 Encounter for gynecological examination (general) (routine) without abnormal findings: Secondary | ICD-10-CM | POA: Diagnosis not present

## 2015-03-14 DIAGNOSIS — R1030 Lower abdominal pain, unspecified: Secondary | ICD-10-CM | POA: Insufficient documentation

## 2015-03-14 LAB — CBC WITH DIFFERENTIAL/PLATELET
BASOS ABS: 0.1 10*3/uL (ref 0.0–0.1)
Basophils Relative: 1 % (ref 0–1)
Eosinophils Absolute: 0.1 10*3/uL (ref 0.0–0.7)
Eosinophils Relative: 2 % (ref 0–5)
HCT: 35 % — ABNORMAL LOW (ref 36.0–46.0)
Hemoglobin: 11.6 g/dL — ABNORMAL LOW (ref 12.0–15.0)
LYMPHS PCT: 31 % (ref 12–46)
Lymphs Abs: 1.9 10*3/uL (ref 0.7–4.0)
MCH: 27 pg (ref 26.0–34.0)
MCHC: 33.1 g/dL (ref 30.0–36.0)
MCV: 81.6 fL (ref 78.0–100.0)
MONO ABS: 0.5 10*3/uL (ref 0.1–1.0)
MPV: 9.8 fL (ref 8.6–12.4)
Monocytes Relative: 8 % (ref 3–12)
Neutro Abs: 3.5 10*3/uL (ref 1.7–7.7)
Neutrophils Relative %: 58 % (ref 43–77)
Platelets: 254 10*3/uL (ref 150–400)
RBC: 4.29 MIL/uL (ref 3.87–5.11)
RDW: 13.9 % (ref 11.5–15.5)
WBC: 6 10*3/uL (ref 4.0–10.5)

## 2015-03-14 LAB — COMPREHENSIVE METABOLIC PANEL
ALBUMIN: 4 g/dL (ref 3.5–5.2)
ALK PHOS: 48 U/L (ref 39–117)
ALT: 34 U/L (ref 0–35)
AST: 189 U/L — ABNORMAL HIGH (ref 0–37)
BUN: 15 mg/dL (ref 6–23)
CO2: 25 meq/L (ref 19–32)
Calcium: 8.9 mg/dL (ref 8.4–10.5)
Chloride: 105 mEq/L (ref 96–112)
Creat: 0.91 mg/dL (ref 0.50–1.10)
GLUCOSE: 87 mg/dL (ref 70–99)
POTASSIUM: 4.1 meq/L (ref 3.5–5.3)
Sodium: 139 mEq/L (ref 135–145)
Total Bilirubin: 0.3 mg/dL (ref 0.2–1.2)
Total Protein: 6.6 g/dL (ref 6.0–8.3)

## 2015-03-14 LAB — LIPID PANEL
CHOL/HDL RATIO: 3 ratio
Cholesterol: 139 mg/dL (ref 0–200)
HDL: 47 mg/dL (ref >=46)
LDL Cholesterol: 82 mg/dL (ref 0–99)
Triglycerides: 48 mg/dL (ref <150)
VLDL: 10 mg/dL (ref 0–40)

## 2015-03-14 LAB — TSH: TSH: 0.936 u[IU]/mL (ref 0.350–4.500)

## 2015-03-14 MED ORDER — PREDNISONE 10 MG (21) PO TBPK: 10.0000 mg | ORAL_TABLET | Freq: Every day | ORAL | Status: DC

## 2015-03-14 MED ORDER — CYCLOBENZAPRINE HCL 5 MG PO TABS: 5.0000 mg | ORAL_TABLET | Freq: Every day | ORAL | Status: DC

## 2015-03-14 MED ORDER — CYCLOBENZAPRINE HCL 10 MG PO TABS: 10.0000 mg | ORAL_TABLET | Freq: Every day | ORAL | Status: DC

## 2015-03-14 MED ORDER — IBUPROFEN 800 MG PO TABS: 800.0000 mg | ORAL_TABLET | Freq: Three times a day (TID) | ORAL | Status: DC | PRN

## 2015-03-14 NOTE — Progress Notes (Signed)
Latoya Murphy 19-Oct-1975 [id number]   History:    39 y.o.  for annual gyn exam whose only complaint is of left groin pains. She stated this started a few days ago after she begin working out and exercising. She has been taking Motrin when necessary.Review of patient's records indicated that she had a TVH back in 2012 and has done well since her surgery. 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 Pathology report from [facility] demonstrated normal cervical pathology.  Past medical history,surgical history, family history and social history were all reviewed and documented in the EPIC chart.  Gynecologic History Patient's last menstrual period was 06/09/2011. Contraception: status post hysterectomy Last Pap: 2015. Results were: normal Last mammogram: Not indicated. Results were: Not indicated  Obstetric History OB History  Gravida Para Term Preterm AB SAB TAB Ectopic Multiple Living  3 3 3       3     # Outcome Date GA Lbr Len/2nd Weight Sex Delivery Anes PTL Lv  3 Term      Vag-Spont   Y  2 Term      Vag-Spont   Y  1 Term      Vag-Spont   Y       ROS: A ROS was performed and pertinent positives and negatives are included in the history.  GENERAL: No fevers or chills. HEENT: No change in vision, no earache, sore throat or sinus congestion. NECK: No pain or stiffness. CARDIOVASCULAR: No chest pain or pressure. No palpitations. PULMONARY: No shortness of breath, cough or wheeze. GASTROINTESTINAL: No abdominal pain, nausea, vomiting or diarrhea, melena or bright red blood per rectum. GENITOURINARY: No urinary frequency, urgency, hesitancy or dysuria. MUSCULOSKELETAL: Left groin pain. DERMATOLOGIC: No rash, no itching, no lesions. ENDOCRINE: No polyuria, polydipsia, no heat or cold intolerance. No recent change in weight. HEMATOLOGICAL: No anemia or easy bruising or bleeding. NEUROLOGIC: No headache, seizures, numbness, tingling or  weakness. PSYCHIATRIC: No depression, no loss of interest in normal activity or change in sleep pattern.     Exam: chaperone present  BP 124/86 mmHg  Ht 5\' 3"  (1.6 m)  Wt 199 lb (90.266 kg)  BMI 35.26 kg/m2  LMP 06/09/2011  Body mass index is 35.26 kg/(m^2).  General appearance : Well developed well nourished female. No acute distress HEENT: Eyes: no retinal hemorrhage or exudates,  Neck supple, trachea midline, no carotid bruits, no thyroidmegaly Lungs: Clear to auscultation, no rhonchi or wheezes, or rib retractions  Heart: Regular rate and rhythm, no murmurs or gallops Breast:Examined in sitting and supine position were symmetrical in appearance, no palpable masses or tenderness,  no skin retraction, no nipple inversion, no nipple discharge, no skin discoloration, no axillary or supraclavicular lymphadenopathy Abdomen: no palpable masses or tenderness, no rebound or guarding Extremities: no edema or skin discoloration or tenderness  Pelvic:  Bartholin, Urethra, Skene Glands: Within normal limits             Vagina: No gross lesions or discharge  Cervix: Absent  Uterus  absent  Adnexa  Without masses or tenderness  Anus and perineum  normal   Rectovaginal  normal sphincter tone without palpated masses or tenderness             Hemoccult not indicated     Assessment/Plan:  39 y.o. female for annual exam with left groin pain attributed to musculoskeletal strain from recent exercise. Discomfort was reproduced on flexion and extension.  There was no evidence of any femoral or inguinal hernia. Patient will be prescribed prednisone (Sterapred uni- pak  21 tab to take over the course of 6 days.). She will also be prescribed Motrin 800 mg one by mouth 3 times a day for 5 days. Also as a muscle relaxant she'll be prescribed Flexeril 10 mg to take 1 tablet at bedtime for 5-7 days. She is also instructed to refrain from exercising for the next 7 days. The following screening labs were  ordered: CBC, comprehensive metabolic panel, lipid profile, TSH, and urinalysis. Pap smear not indicated according to the new guidelines. Patient was reminded to do her monthly breast exam. Patient will have her first baseline mammogram in 2017.   Terrance Mass MD, 9:34 AM 03/14/2015

## 2015-03-14 NOTE — Patient Instructions (Signed)

## 2015-03-15 LAB — URINALYSIS W MICROSCOPIC + REFLEX CULTURE
BACTERIA UA: NONE SEEN
BILIRUBIN URINE: NEGATIVE
Casts: NONE SEEN
Crystals: NONE SEEN
Glucose, UA: NEGATIVE mg/dL
HGB URINE DIPSTICK: NEGATIVE
KETONES UR: NEGATIVE mg/dL
Leukocytes, UA: NEGATIVE
Nitrite: NEGATIVE
PH: 6 (ref 5.0–8.0)
PROTEIN: NEGATIVE mg/dL
Specific Gravity, Urine: 1.013 (ref 1.005–1.030)
Squamous Epithelial / LPF: NONE SEEN
Urobilinogen, UA: 0.2 mg/dL (ref 0.0–1.0)

## 2015-03-21 ENCOUNTER — Encounter: Payer: Self-pay | Admitting: Gynecology

## 2015-04-04 ENCOUNTER — Other Ambulatory Visit: Payer: Self-pay | Admitting: Gynecology

## 2015-04-04 DIAGNOSIS — R945 Abnormal results of liver function studies: Principal | ICD-10-CM

## 2015-04-04 DIAGNOSIS — R7989 Other specified abnormal findings of blood chemistry: Secondary | ICD-10-CM

## 2015-04-06 ENCOUNTER — Other Ambulatory Visit: Payer: 59

## 2015-04-06 DIAGNOSIS — R7989 Other specified abnormal findings of blood chemistry: Secondary | ICD-10-CM

## 2015-04-06 DIAGNOSIS — R7309 Other abnormal glucose: Secondary | ICD-10-CM

## 2015-04-06 DIAGNOSIS — R945 Abnormal results of liver function studies: Secondary | ICD-10-CM

## 2015-04-07 ENCOUNTER — Other Ambulatory Visit: Payer: Self-pay | Admitting: Gynecology

## 2015-04-07 DIAGNOSIS — R945 Abnormal results of liver function studies: Principal | ICD-10-CM

## 2015-04-07 DIAGNOSIS — R7989 Other specified abnormal findings of blood chemistry: Secondary | ICD-10-CM

## 2015-04-07 LAB — HEPATITIS PANEL, ACUTE
HCV AB: NEGATIVE
Hep A IgM: NONREACTIVE
Hep B C IgM: NONREACTIVE
Hepatitis B Surface Ag: NEGATIVE

## 2015-04-08 LAB — GLUCOSE, RANDOM: GLUCOSE: 84 mg/dL (ref 70–99)

## 2015-04-08 LAB — ALT: ALT: 11 U/L (ref 0–35)

## 2015-04-08 LAB — AST: AST: 26 U/L (ref 0–37)

## 2015-05-26 ENCOUNTER — Ambulatory Visit: Payer: 59 | Admitting: Gynecology

## 2015-06-06 ENCOUNTER — Encounter: Payer: Self-pay | Admitting: Gynecology

## 2015-06-06 ENCOUNTER — Ambulatory Visit (INDEPENDENT_AMBULATORY_CARE_PROVIDER_SITE_OTHER): Payer: 59 | Admitting: Gynecology

## 2015-06-06 VITALS — BP 128/76

## 2015-06-06 DIAGNOSIS — K921 Melena: Secondary | ICD-10-CM | POA: Diagnosis not present

## 2015-06-06 DIAGNOSIS — K5901 Slow transit constipation: Secondary | ICD-10-CM | POA: Diagnosis not present

## 2015-06-06 MED ORDER — LINACLOTIDE 145 MCG PO CAPS: 145.0000 ug | ORAL_CAPSULE | Freq: Every day | ORAL | Status: DC

## 2015-06-06 NOTE — Progress Notes (Signed)
   Patient is a 39 year old who presented to the office today stating that approximately 2 weeks ago she noted blood after having a bowel movement. She has had issues with constipation and she is taking MiraLAX twice a day. This cleared up and then again last week it up and one day after straining. She states now her bowel movements a little bit more loose and she had not noticed any blood. She denied any fever, chills, nausea, vomiting. Patient with past history of vaginal hysterectomy. Patient denies any weight changes or any change in appetite.  Exam: Blood pressure 128/76 Gen. appearance well-developed 1 nourished female with the above-mentioned complaint External genitalia with no abnormalities noted Patient was placed in the knee-chest position and with an anoscope inserted beyond the anal verge there was no evidence of any internal or external hemorrhoids are any fissures.  Assessment/plan: Patient with constipation may have contributed to irritation and the bleeding episode that she had experienced. I'm going to prescribe Linzess 145 g daily that she can take in the evening and then in the morning take MiraLAX 1 tablespoon with juice. If she experiences any further bleeding with normal soft stools she will contact the office on can refer to the gastroenterologist for colonoscopy. She denies any family members with any history of colorectal disease.

## 2015-06-06 NOTE — Patient Instructions (Signed)
Constipation  Constipation is when a person has fewer than three bowel movements a week, has difficulty having a bowel movement, or has stools that are dry, hard, or larger than normal. As people grow older, constipation is more common. If you try to fix constipation with medicines that make you have a bowel movement (laxatives), the problem may get worse. Long-term laxative use may cause the muscles of the colon to become weak. A low-fiber diet, not taking in enough fluids, and taking certain medicines may make constipation worse.   CAUSES   · Certain medicines, such as antidepressants, pain medicine, iron supplements, antacids, and water pills.    · Certain diseases, such as diabetes, irritable bowel syndrome (IBS), thyroid disease, or depression.    · Not drinking enough water.    · Not eating enough fiber-rich foods.    · Stress or travel.    · Lack of physical activity or exercise.    · Ignoring the urge to have a bowel movement.    · Using laxatives too much.    SIGNS AND SYMPTOMS   · Having fewer than three bowel movements a week.    · Straining to have a bowel movement.    · Having stools that are hard, dry, or larger than normal.    · Feeling full or bloated.    · Pain in the lower abdomen.    · Not feeling relief after having a bowel movement.    DIAGNOSIS   Your health care provider will take a medical history and perform a physical exam. Further testing may be done for severe constipation. Some tests may include:  · A barium enema X-ray to examine your rectum, colon, and, sometimes, your small intestine.    · A sigmoidoscopy to examine your lower colon.    · A colonoscopy to examine your entire colon.  TREATMENT   Treatment will depend on the severity of your constipation and what is causing it. Some dietary treatments include drinking more fluids and eating more fiber-rich foods. Lifestyle treatments may include regular exercise. If these diet and lifestyle recommendations do not help, your health care  provider may recommend taking over-the-counter laxative medicines to help you have bowel movements. Prescription medicines may be prescribed if over-the-counter medicines do not work.   HOME CARE INSTRUCTIONS   · Eat foods that have a lot of fiber, such as fruits, vegetables, whole grains, and beans.  · Limit foods high in fat and processed sugars, such as french fries, hamburgers, cookies, candies, and soda.    · A fiber supplement may be added to your diet if you cannot get enough fiber from foods.    · Drink enough fluids to keep your urine clear or pale yellow.    · Exercise regularly or as directed by your health care provider.    · Go to the restroom when you have the urge to go. Do not hold it.    · Only take over-the-counter or prescription medicines as directed by your health care provider. Do not take other medicines for constipation without talking to your health care provider first.    SEEK IMMEDIATE MEDICAL CARE IF:   · You have bright red blood in your stool.    · Your constipation lasts for more than 4 days or gets worse.    · You have abdominal or rectal pain.    · You have thin, pencil-like stools.    · You have unexplained weight loss.  MAKE SURE YOU:   · Understand these instructions.  · Will watch your condition.  · Will get help right away if you are not   you have with your health care provider. Linaclotide oral capsules What is this medicine? LINACLOTIDE (lin a KLOE tide) is used to treat irritable bowel syndrome (IBS) with constipation as the main problem. It may also be used for relief of chronic constipation. This medicine may be used for other purposes; ask your health care provider or pharmacist  if you have questions. COMMON BRAND NAME(S): Linzess What should I tell my health care provider before I take this medicine? They need to know if you have any of these conditions: -history of stool (fecal) impaction -now have diarrhea or have diarrhea often -other medical condition -stomach or intestinal disease, including bowel obstruction or abdominal adhesions -an unusual or allergic reaction to linaclotide, other medicines, foods, dyes, or preservatives -pregnant or trying to get pregnant -breast-feeding How should I use this medicine? Take this medicine by mouth with a glass of water. Follow the directions on the prescription label. Do not cut, crush or chew this medicine. Take on an empty stomach, at least 30 minutes before your first meal of the day. Take your medicine at regular intervals. Do not take your medicine more often than directed. Do not stop taking except on your doctor's advice. A special MedGuide will be given to you by the pharmacist with each prescription and refill. Be sure to read this information carefully each time. Talk to your pediatrician regarding the use of this medicine in children. This medicine is not approved for use in children. Overdosage: If you think you've taken too much of this medicine contact a poison control center or emergency room at once. Overdosage: If you think you have taken too much of this medicine contact a poison control center or emergency room at once. NOTE: This medicine is only for you. Do not share this medicine with others. What if I miss a dose? If you miss a dose, just skip that dose. Wait until your next dose, and take only that dose. Do not take double or extra doses. What may interact with this medicine? -certain medicines for bowel problems or bladder incontinence (these can cause constipation) This list may not describe all possible interactions. Give your health care provider a list of all the medicines, herbs,  non-prescription drugs, or dietary supplements you use. Also tell them if you smoke, drink alcohol, or use illegal drugs. Some items may interact with your medicine. What should I watch for while using this medicine? Visit your doctor for regular check ups. Tell your doctor if your symptoms do not get better or if they get worse. Diarrhea is a common side effect of this medicine. It often begins within 2 weeks of starting this medicine. Stop taking this medicine and call your doctor if you get severe diarrhea. Stop taking this medicine and call your doctor or go to the nearest hospital emergency room right away if you develop unusual or severe stomach-area (abdominal) pain, especially if you also have bright red, bloody stools or black stools that look like tar. What side effects may I notice from receiving this medicine? Side effects that you should report to your doctor or health care professional as soon as possible: -allergic reactions like skin rash, itching or hives, swelling of the face, lips, or tongue -black, tarry stools -bloody or watery diarrhea -new or worsening stomach pain -severe or prolonged diarrhea Side effects that usually do not require medical attention (Report these to your doctor or health care professional if they continue or are bothersome.): -bloating -gas -loose stools This  list may not describe all possible side effects. Call your doctor for medical advice about side effects. You may report side effects to FDA at 1-800-FDA-1088. Where should I keep my medicine? Keep out of the reach of children. Store at room temperature between 20 and 25 degrees C (68 and 77 degrees F). Keep this medicine in the original container. Keep tightly closed in a dry place. Do not remove the desiccant packet from the bottle, it helps to protect your medicine from moisture. Throw away any unused medicine after the expiration date. NOTE: This sheet is a summary. It may not cover all possible  information. If you have questions about this medicine, talk to your doctor, pharmacist, or health care provider.  2015, Elsevier/Gold Standard. (2011-05-21 13:05:27)

## 2016-01-24 ENCOUNTER — Encounter: Payer: Self-pay | Admitting: Gynecology

## 2016-01-25 ENCOUNTER — Other Ambulatory Visit: Payer: Self-pay | Admitting: Gynecology

## 2016-01-25 MED ORDER — SUMATRIPTAN SUCCINATE 50 MG PO TABS: 50.0000 mg | ORAL_TABLET | Freq: Once | ORAL | Status: DC

## 2016-01-25 MED FILL — SUMATRIPTAN SUCC 50 MG TAB: 50 | 20 days supply | Qty: 6 | Fill #0

## 2016-03-14 ENCOUNTER — Ambulatory Visit (INDEPENDENT_AMBULATORY_CARE_PROVIDER_SITE_OTHER): Payer: 59 | Admitting: Gynecology

## 2016-03-14 ENCOUNTER — Encounter: Payer: Self-pay | Admitting: Gynecology

## 2016-03-14 VITALS — BP 108/68 | Ht 63.0 in | Wt 196.0 lb

## 2016-03-14 DIAGNOSIS — Z01419 Encounter for gynecological examination (general) (routine) without abnormal findings: Secondary | ICD-10-CM

## 2016-03-14 LAB — CBC WITH DIFFERENTIAL/PLATELET
BASOS PCT: 0 %
Basophils Absolute: 0 cells/uL (ref 0–200)
Eosinophils Absolute: 104 cells/uL (ref 15–500)
Eosinophils Relative: 2 %
HEMATOCRIT: 35.7 % (ref 35.0–45.0)
Hemoglobin: 12 g/dL (ref 11.7–15.5)
LYMPHS ABS: 1976 {cells}/uL (ref 850–3900)
LYMPHS PCT: 38 %
MCH: 27.3 pg (ref 27.0–33.0)
MCHC: 33.6 g/dL (ref 32.0–36.0)
MCV: 81.1 fL (ref 80.0–100.0)
MONO ABS: 364 {cells}/uL (ref 200–950)
MPV: 9.9 fL (ref 7.5–12.5)
Monocytes Relative: 7 %
NEUTROS ABS: 2756 {cells}/uL (ref 1500–7800)
Neutrophils Relative %: 53 %
Platelets: 243 10*3/uL (ref 140–400)
RBC: 4.4 MIL/uL (ref 3.80–5.10)
RDW: 13.6 % (ref 11.0–15.0)
WBC: 5.2 10*3/uL (ref 3.8–10.8)

## 2016-03-14 LAB — LIPID PANEL
CHOL/HDL RATIO: 4 ratio (ref <=5.0)
CHOLESTEROL: 174 mg/dL (ref 125–200)
HDL: 43 mg/dL — ABNORMAL LOW (ref >=46)
LDL Cholesterol: 123 mg/dL (ref <130)
Triglycerides: 38 mg/dL (ref <150)
VLDL: 8 mg/dL (ref <30)

## 2016-03-14 LAB — COMPREHENSIVE METABOLIC PANEL
ALK PHOS: 59 U/L (ref 33–115)
ALT: 8 U/L (ref 6–29)
AST: 17 U/L (ref 10–30)
Albumin: 3.8 g/dL (ref 3.6–5.1)
BILIRUBIN TOTAL: 0.3 mg/dL (ref 0.2–1.2)
BUN: 11 mg/dL (ref 7–25)
CALCIUM: 9.2 mg/dL (ref 8.6–10.2)
CO2: 25 mmol/L (ref 20–31)
Chloride: 108 mmol/L (ref 98–110)
Creat: 0.9 mg/dL (ref 0.50–1.10)
Glucose, Bld: 87 mg/dL (ref 65–99)
POTASSIUM: 4 mmol/L (ref 3.5–5.3)
Sodium: 140 mmol/L (ref 135–146)
Total Protein: 6.6 g/dL (ref 6.1–8.1)

## 2016-03-14 LAB — TSH: TSH: 0.66 mIU/L

## 2016-03-14 NOTE — Progress Notes (Signed)
Latoya Murphy 06-25-1976 [id number]   History:    40 y.o.  for annual gyn exam with no complaints today.Review of patient's records indicated that she had a TVH back in 2012 and has done well since her surgery. 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 Pathology report from [facility] demonstrated normal cervical pathology.  Past medical history,surgical history, family history and social history were all reviewed and documented in the EPIC chart.  Gynecologic History Patient's last menstrual period was 06/09/2011. Contraception: status post hysterectomy Last Pap: 2015. Results were: normal Last mammogram: Not indicated. Results were: Not indicated  Obstetric History OB History  Gravida Para Term Preterm AB SAB TAB Ectopic Multiple Living  3 3 3       3     # Outcome Date GA Lbr Len/2nd Weight Sex Delivery Anes PTL Lv  3 Term      Vag-Spont   Y  2 Term      Vag-Spont   Y  1 Term      Vag-Spont   Y       ROS: A ROS was performed and pertinent positives and negatives are included in the history.  GENERAL: No fevers or chills. HEENT: No change in vision, no earache, sore throat or sinus congestion. NECK: No pain or stiffness. CARDIOVASCULAR: No chest pain or pressure. No palpitations. PULMONARY: No shortness of breath, cough or wheeze. GASTROINTESTINAL: No abdominal pain, nausea, vomiting or diarrhea, melena or bright red blood per rectum. GENITOURINARY: No urinary frequency, urgency, hesitancy or dysuria. MUSCULOSKELETAL: No joint or muscle pain, no back pain, no recent trauma. DERMATOLOGIC: No rash, no itching, no lesions. ENDOCRINE: No polyuria, polydipsia, no heat or cold intolerance. No recent change in weight. HEMATOLOGICAL: No anemia or easy bruising or bleeding. NEUROLOGIC: No headache, seizures, numbness, tingling or weakness. PSYCHIATRIC: No depression, no loss of interest in normal activity or change in sleep pattern.       Exam: chaperone present  BP 108/68 mmHg  Ht 5\' 3"  (1.6 m)  Wt 196 lb (88.905 kg)  BMI 34.73 kg/m2  LMP 06/09/2011  Body mass index is 34.73 kg/(m^2).  General appearance : Well developed well nourished female. No acute distress HEENT: Eyes: no retinal hemorrhage or exudates,  Neck supple, trachea midline, no carotid bruits, no thyroidmegaly Lungs: Clear to auscultation, no rhonchi or wheezes, or rib retractions  Heart: Regular rate and rhythm, no murmurs or gallops Breast:Examined in sitting and supine position were symmetrical in appearance, no palpable masses or tenderness,  no skin retraction, no nipple inversion, no nipple discharge, no skin discoloration, no axillary or supraclavicular lymphadenopathy Abdomen: no palpable masses or tenderness, no rebound or guarding Extremities: no edema or skin discoloration or tenderness  Pelvic:  Bartholin, Urethra, Skene Glands: Within normal limits             Vagina: No gross lesions or discharge  Cervix: Absent  Uterus  absent  Adnexa  Without masses or tenderness  Anus and perineum  normal   Rectovaginal  normal sphincter tone without palpated masses or tenderness             Hemoccult not indicated     Assessment/Plan:  40 y.o. female for annual exam who will be 40 years old next month. I've given her requisition for sometime in August and schedule her baseline mammogram. We discussed importance of monthly breast exams. We discussed importance of regular exercise. The  following fasting blood work was ordered today: Comprehensive metabolic panel, fasting lipid profile, TSH, CBC, and urinalysis.   Terrance Mass MD, 8:50 AM 03/14/2016

## 2016-03-15 LAB — URINALYSIS W MICROSCOPIC + REFLEX CULTURE
BILIRUBIN URINE: NEGATIVE
Bacteria, UA: NONE SEEN [HPF]
Casts: NONE SEEN [LPF]
Crystals: NONE SEEN [HPF]
GLUCOSE, UA: NEGATIVE
Hgb urine dipstick: NEGATIVE
Ketones, ur: NEGATIVE
LEUKOCYTES UA: NEGATIVE
NITRITE: NEGATIVE
Protein, ur: NEGATIVE
RBC / HPF: NONE SEEN RBC/HPF (ref <=2)
Specific Gravity, Urine: 1.017 (ref 1.001–1.035)
Squamous Epithelial / LPF: NONE SEEN [HPF] (ref <=5)
WBC UA: NONE SEEN WBC/HPF (ref <=5)
Yeast: NONE SEEN [HPF]
pH: 5.5 (ref 5.0–8.0)

## 2016-06-25 ENCOUNTER — Other Ambulatory Visit: Payer: Self-pay | Admitting: Gynecology

## 2016-06-25 DIAGNOSIS — Z1231 Encounter for screening mammogram for malignant neoplasm of breast: Secondary | ICD-10-CM

## 2016-07-15 ENCOUNTER — Ambulatory Visit
Admission: RE | Admit: 2016-07-15 | Discharge: 2016-07-15 | Disposition: A | Discharge status: Home / Self Care | Payer: 59 | Source: Ambulatory Visit | Attending: Gynecology | Admitting: Gynecology

## 2016-07-15 DIAGNOSIS — Z1231 Encounter for screening mammogram for malignant neoplasm of breast: Secondary | ICD-10-CM

## 2016-07-22 ENCOUNTER — Encounter: Payer: Self-pay | Admitting: Gynecology

## 2016-07-28 ENCOUNTER — Encounter: Payer: Self-pay | Admitting: Gynecology

## 2016-07-29 ENCOUNTER — Encounter: Payer: Self-pay | Admitting: Gynecology

## 2016-07-29 ENCOUNTER — Other Ambulatory Visit: Payer: Self-pay

## 2016-07-29 MED ORDER — SUMATRIPTAN SUCCINATE 50 MG PO TABS: 50.0000 mg | ORAL_TABLET | Freq: Once | ORAL | 1 refills | Status: DC

## 2016-07-29 MED ORDER — IBUPROFEN 800 MG PO TABS: 800.0000 mg | ORAL_TABLET | Freq: Three times a day (TID) | ORAL | 1 refills | Status: DC | PRN

## 2016-07-29 MED FILL — IBUPROFEN 800 MG TABLET: 800 | 10 days supply | Qty: 30 | Fill #0

## 2016-07-29 MED FILL — SUMATRIPTAN SUCC 50 MG TAB: 50 | 30 days supply | Qty: 9 | Fill #0

## 2016-07-29 NOTE — Telephone Encounter (Signed)
Dr. Moshe Salisbury- Did you see in her e-mail that she asked if you would prescribe more than #6 of the [place]?

## 2016-11-05 DIAGNOSIS — R0602 Shortness of breath: Secondary | ICD-10-CM | POA: Diagnosis not present

## 2016-11-05 DIAGNOSIS — R5383 Other fatigue: Secondary | ICD-10-CM | POA: Diagnosis not present

## 2016-11-05 DIAGNOSIS — R635 Abnormal weight gain: Secondary | ICD-10-CM | POA: Diagnosis not present

## 2016-11-05 DIAGNOSIS — R5381 Other malaise: Secondary | ICD-10-CM | POA: Diagnosis not present

## 2016-11-05 MED FILL — PHENTERMINE 37.5 MG TABLET: 37.5 | 30 days supply | Qty: 30 | Fill #0

## 2016-11-23 DIAGNOSIS — Z0100 Encounter for examination of eyes and vision without abnormal findings: Secondary | ICD-10-CM | POA: Diagnosis not present

## 2016-12-09 DIAGNOSIS — Z9889 Other specified postprocedural states: Secondary | ICD-10-CM | POA: Diagnosis not present

## 2016-12-09 DIAGNOSIS — R635 Abnormal weight gain: Secondary | ICD-10-CM | POA: Diagnosis not present

## 2016-12-09 DIAGNOSIS — Z90710 Acquired absence of both cervix and uterus: Secondary | ICD-10-CM | POA: Diagnosis not present

## 2016-12-09 DIAGNOSIS — E559 Vitamin D deficiency, unspecified: Secondary | ICD-10-CM | POA: Diagnosis not present

## 2016-12-13 MED FILL — PHENTERMINE 37.5 MG TABLET: 37.5 | 30 days supply | Qty: 30 | Fill #0

## 2017-01-29 ENCOUNTER — Encounter: Payer: Self-pay | Admitting: Gynecology

## 2017-03-17 ENCOUNTER — Encounter: Payer: 59 | Admitting: Gynecology

## 2017-03-20 ENCOUNTER — Encounter: Payer: 59 | Admitting: Gynecology

## 2017-03-27 ENCOUNTER — Encounter: Payer: Self-pay | Admitting: Gynecology

## 2017-03-27 ENCOUNTER — Ambulatory Visit (INDEPENDENT_AMBULATORY_CARE_PROVIDER_SITE_OTHER): Payer: 59 | Admitting: Gynecology

## 2017-03-27 VITALS — BP 118/80 | Ht 63.0 in | Wt 199.0 lb

## 2017-03-27 DIAGNOSIS — Z01419 Encounter for gynecological examination (general) (routine) without abnormal findings: Secondary | ICD-10-CM | POA: Diagnosis not present

## 2017-03-27 LAB — URINALYSIS W MICROSCOPIC + REFLEX CULTURE
Bacteria, UA: NONE SEEN [HPF]
Bilirubin Urine: NEGATIVE
CASTS: NONE SEEN [LPF]
Crystals: NONE SEEN [HPF]
GLUCOSE, UA: NEGATIVE
Hgb urine dipstick: NEGATIVE
KETONES UR: NEGATIVE
LEUKOCYTES UA: NEGATIVE
NITRITE: NEGATIVE
PH: 5.5 (ref 5.0–8.0)
Protein, ur: NEGATIVE
SPECIFIC GRAVITY, URINE: 1.017 (ref 1.001–1.035)
Yeast: NONE SEEN [HPF]

## 2017-03-27 LAB — LIPID PANEL
CHOLESTEROL: 176 mg/dL (ref <200)
HDL: 47 mg/dL — AB (ref >50)
LDL CALC: 113 mg/dL — AB (ref <100)
TRIGLYCERIDES: 81 mg/dL (ref <150)
Total CHOL/HDL Ratio: 3.7 Ratio (ref <5.0)
VLDL: 16 mg/dL (ref <30)

## 2017-03-27 LAB — COMPREHENSIVE METABOLIC PANEL
ALBUMIN: 4.2 g/dL (ref 3.6–5.1)
ALT: 7 U/L (ref 6–29)
AST: 19 U/L (ref 10–30)
Alkaline Phosphatase: 60 U/L (ref 33–115)
BILIRUBIN TOTAL: 0.6 mg/dL (ref 0.2–1.2)
BUN: 10 mg/dL (ref 7–25)
CALCIUM: 9 mg/dL (ref 8.6–10.2)
CHLORIDE: 107 mmol/L (ref 98–110)
CO2: 23 mmol/L (ref 20–31)
Creat: 0.94 mg/dL (ref 0.50–1.10)
Glucose, Bld: 88 mg/dL (ref 65–99)
Potassium: 4 mmol/L (ref 3.5–5.3)
Sodium: 138 mmol/L (ref 135–146)
TOTAL PROTEIN: 7 g/dL (ref 6.1–8.1)

## 2017-03-27 LAB — CBC WITH DIFFERENTIAL/PLATELET
BASOS ABS: 51 {cells}/uL (ref 0–200)
Basophils Relative: 1 %
EOS ABS: 102 {cells}/uL (ref 15–500)
Eosinophils Relative: 2 %
HEMATOCRIT: 37.2 % (ref 35.0–45.0)
HEMOGLOBIN: 12.4 g/dL (ref 11.7–15.5)
LYMPHS ABS: 2040 {cells}/uL (ref 850–3900)
Lymphocytes Relative: 40 %
MCH: 27.5 pg (ref 27.0–33.0)
MCHC: 33.3 g/dL (ref 32.0–36.0)
MCV: 82.5 fL (ref 80.0–100.0)
MONO ABS: 459 {cells}/uL (ref 200–950)
MPV: 9.4 fL (ref 7.5–12.5)
Monocytes Relative: 9 %
NEUTROS PCT: 48 %
Neutro Abs: 2448 cells/uL (ref 1500–7800)
Platelets: 275 10*3/uL (ref 140–400)
RBC: 4.51 MIL/uL (ref 3.80–5.10)
RDW: 13.5 % (ref 11.0–15.0)
WBC: 5.1 10*3/uL (ref 3.8–10.8)

## 2017-03-27 LAB — TSH: TSH: 1.02 mIU/L

## 2017-03-27 MED ORDER — SUMATRIPTAN SUCCINATE 50 MG PO TABS: 50.0000 mg | ORAL_TABLET | ORAL | 5 refills | Status: DC | PRN

## 2017-03-27 MED FILL — SUMATRIPTAN SUCC 50 MG TAB: 50 | 28 days supply | Qty: 10 | Fill #0

## 2017-03-27 NOTE — Progress Notes (Signed)
Latoya Murphy 24-Jun-1976 [id number]   History:    41 y.o.  for annual gyn exam is asymptomatic today.Review of patient's records indicated that she had a TVH back in 2012 and has done well since her surgery. 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 Pathology report from [facility] demonstrated normal cervical pathology.  Past medical history,surgical history, family history and social history were all reviewed and documented in the EPIC chart.  Gynecologic History Patient's last menstrual period was 07/18/2011. Contraception: status post hysterectomy Last Pap: 2015. Results were: normal Last mammogram: 2017. Results were: normal  Obstetric History OB History  Gravida Para Term Preterm AB Living  3 3 3     3   SAB TAB Ectopic Multiple Live Births          3    # Outcome Date GA Lbr Len/2nd Weight Sex Delivery Anes PTL Lv  3 Term      Vag-Spont   LIV  2 Term      Vag-Spont   LIV  1 Term      Vag-Spont   LIV       ROS: A ROS was performed and pertinent positives and negatives are included in the history.  GENERAL: No fevers or chills. HEENT: No change in vision, no earache, sore throat or sinus congestion. NECK: No pain or stiffness. CARDIOVASCULAR: No chest pain or pressure. No palpitations. PULMONARY: No shortness of breath, cough or wheeze. GASTROINTESTINAL: No abdominal pain, nausea, vomiting or diarrhea, melena or bright red blood per rectum. GENITOURINARY: No urinary frequency, urgency, hesitancy or dysuria. MUSCULOSKELETAL: No joint or muscle pain, no back pain, no recent trauma. DERMATOLOGIC: No rash, no itching, no lesions. ENDOCRINE: No polyuria, polydipsia, no heat or cold intolerance. No recent change in weight. HEMATOLOGICAL: No anemia or easy bruising or bleeding. NEUROLOGIC: No headache, seizures, numbness, tingling or weakness. PSYCHIATRIC: No depression, no loss of interest in normal activity or change in sleep  pattern.     Exam: chaperone present  BP 118/80   Ht 5\' 3"  (1.6 m)   Wt 199 lb (90.3 kg)   LMP 07/18/2011   BMI 35.25 kg/m   Body mass index is 35.25 kg/m.  General appearance : Well developed well nourished female. No acute distress HEENT: Eyes: no retinal hemorrhage or exudates,  Neck supple, trachea midline, no carotid bruits, no thyroidmegaly Lungs: Clear to auscultation, no rhonchi or wheezes, or rib retractions  Heart: Regular rate and rhythm, no murmurs or gallops Breast:Examined in sitting and supine position were symmetrical in appearance, no palpable masses or tenderness,  no skin retraction, no nipple inversion, no nipple discharge, no skin discoloration, no axillary or supraclavicular lymphadenopathy Abdomen: no palpable masses or tenderness, no rebound or guarding Extremities: no edema or skin discoloration or tenderness  Pelvic:  Bartholin, Urethra, Skene Glands: Within normal limits             Vagina: No gross lesions or discharge  Cervix: Absent  Uterus  absent  Adnexa  Without masses or tenderness  Anus and perineum  normal   Rectovaginal  normal sphincter tone without palpated masses or tenderness             Hemoccult not indicated     Assessment/Plan:  41 y.o. female for annual exam doing well normal exam today. The following fasting blood work will be drawn today: Comprehensive metabolic panel, fasting lipid profile, TSH, CBC, and urinalysis.  Patient was reminded to schedule her mammogram for October this year. Patient no longer needs Pap smear according to the new guidelines.   Terrance Mass MD, 9:03 AM 03/27/2017

## 2017-03-27 NOTE — Addendum Note (Signed)
Addended by: Terrance Mass on: 03/27/2017 09:34 AM   Modules accepted: Orders

## 2017-03-28 LAB — URINE CULTURE: Organism ID, Bacteria: NO GROWTH

## 2017-05-28 ENCOUNTER — Other Ambulatory Visit: Payer: Self-pay | Admitting: Internal Medicine

## 2017-05-28 DIAGNOSIS — R222 Localized swelling, mass and lump, trunk: Secondary | ICD-10-CM

## 2017-06-02 ENCOUNTER — Ambulatory Visit
Admission: RE | Admit: 2017-06-02 | Discharge: 2017-06-02 | Disposition: A | Discharge status: Home / Self Care | Payer: 59 | Source: Ambulatory Visit | Attending: Internal Medicine | Admitting: Internal Medicine

## 2017-06-02 DIAGNOSIS — R222 Localized swelling, mass and lump, trunk: Secondary | ICD-10-CM | POA: Diagnosis not present

## 2017-06-02 MED ORDER — IOPAMIDOL (ISOVUE-300) INJECTION 61%
75.0000 mL | Freq: Once | INTRAVENOUS | Status: AC | PRN
Start: 2017-06-02 — End: 2017-06-02
  Administered 2017-06-02: 75 mL via INTRAVENOUS

## 2017-06-13 ENCOUNTER — Other Ambulatory Visit: Payer: 59

## 2017-06-18 ENCOUNTER — Other Ambulatory Visit: Payer: Self-pay | Admitting: Internal Medicine

## 2017-06-18 DIAGNOSIS — Z1231 Encounter for screening mammogram for malignant neoplasm of breast: Secondary | ICD-10-CM

## 2017-07-17 ENCOUNTER — Ambulatory Visit
Admission: RE | Admit: 2017-07-17 | Discharge: 2017-07-17 | Disposition: A | Discharge status: Home / Self Care | Payer: 59 | Source: Ambulatory Visit | Attending: Internal Medicine | Admitting: Internal Medicine

## 2017-07-17 DIAGNOSIS — Z1231 Encounter for screening mammogram for malignant neoplasm of breast: Secondary | ICD-10-CM | POA: Diagnosis not present

## 2017-07-22 MED FILL — CHLORHEXIDINE 0.12% RINSE: 0.12 | 30 days supply | Qty: 473 | Fill #0

## 2017-07-22 MED FILL — NAPROXEN SODIUM 550 MG TAB: 550 | 6 days supply | Qty: 12 | Fill #0

## 2017-09-03 MED FILL — NAPROXEN SODIUM 550 MG TAB: 550 | 6 days supply | Qty: 12 | Fill #0

## 2017-11-20 MED FILL — PREVIDENT 5000 BOOSTER PLUS: 1.1 | 20 days supply | Qty: 100 | Fill #0

## 2017-12-22 DIAGNOSIS — Z0100 Encounter for examination of eyes and vision without abnormal findings: Secondary | ICD-10-CM | POA: Diagnosis not present

## 2018-01-20 ENCOUNTER — Encounter: Payer: Self-pay | Admitting: Women's Health

## 2018-01-20 ENCOUNTER — Ambulatory Visit: Payer: 59 | Admitting: Women's Health

## 2018-01-20 VITALS — BP 118/78

## 2018-01-20 DIAGNOSIS — Z113 Encounter for screening for infections with a predominantly sexual mode of transmission: Secondary | ICD-10-CM

## 2018-01-20 DIAGNOSIS — A599 Trichomoniasis, unspecified: Secondary | ICD-10-CM | POA: Diagnosis not present

## 2018-01-20 LAB — WET PREP FOR TRICH, YEAST, CLUE

## 2018-01-20 MED ORDER — METRONIDAZOLE 500 MG PO TABS: ORAL_TABLET | ORAL | 0 refills | Status: DC

## 2018-01-20 MED FILL — metroNIDAZOLE 500 MG TABS: 500 | 7 days supply | Qty: 14 | Fill #0

## 2018-01-20 NOTE — Progress Notes (Signed)
41 year old MPF G3, P3 presents with numerous complaints, that have increased over the past several months.  Increased  vaginal discharge with odor, generalized body aches with joint pain, situational stress mostly at work, weight gain, occasional dizziness without syncope.  2012 TVH for menorrhagia.  Denies urinary symptoms, abdominal pain, fever.  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  Exam: Appears well.  No CVAT.  Abdomen soft, obese, nontender, external genitalia within normal limits, speculum exam moderate amount of a white discharge with odor noted, wet prep positive for Trichomonas, clue's, TNTC bacteria.  Bimanual no adnexal tenderness.  Trichomonas STD screen Situational stress Weight gain  Plan: Flagyl 2 g p.o. for both she and her husband , instructions given, reviewed importance of alcohol avoidance.  GC/chlamydia, HIV, hep B, C, RPR.  Reviewed importance of counseling, several counselor names given.  Reviewed importance of increasing exercise, decreasing calorie/carbs for weight loss.  Instructed to call if continued discharge.  Annual exam due in July we will schedule.

## 2018-01-20 NOTE — Patient Instructions (Signed)
Trichomoniasis Trichomoniasis is an STI (sexually transmitted infection) that can affect both women and men. In women, the outer area of the female genitalia (vulva) and the vagina are affected. In men, the penis is mainly affected, but the prostate and other reproductive organs can also be involved. This condition can be treated with medicine. It often has no symptoms (is asymptomatic), especially in men. What are the causes? This condition is caused by an organism called Trichomonas vaginalis. Trichomoniasis most often spreads from person to person (is contagious) through sexual contact. What increases the risk? The following factors may make you more likely to develop this condition:  Having unprotected sexual intercourse.  Having sexual intercourse with a partner who has trichomoniasis.  Having multiple sexual partners.  Having had previous trichomoniasis infections or other STIs.  What are the signs or symptoms? In women, symptoms of trichomoniasis include:  Abnormal vaginal discharge that is clear, white, gray, or yellow-green and foamy and has an unusual "fishy" odor.  Itching and irritation of the vagina and vulva.  Burning or pain during urination or sexual intercourse.  Genital redness and swelling.  In men, symptoms of trichomoniasis include:  Penile discharge that may be foamy or contain pus.  Pain in the penis. This may happen only when urinating.  Itching or irritation inside the penis.  Burning after urination or ejaculation.  How is this diagnosed? In women, this condition may be found during a routine Pap test or physical exam. It may be found in men during a routine physical exam. Your health care provider may perform tests to help diagnose this infection, such as:  Urine tests (men and women).  The following in women: ? Testing the pH of the vagina. ? A vaginal swab test that checks for the Trichomonas vaginalis organism. ? Testing vaginal  secretions.  Your health care provider may test you for other STIs, including HIV (human immunodeficiency virus). How is this treated? This condition is treated with medicine taken by mouth (orally), such as metronidazole or tinidazole to fight the infection. Your sexual partner(s) may also need to be tested and treated.  If you are a woman and you plan to become pregnant or think you may be pregnant, tell your health care provider right away. Some medicines that are used to treat the infection should not be taken during pregnancy.  Your health care provider may recommend over-the-counter medicines or creams to help relieve itching or irritation. You may be tested for infection again 3 months after treatment. Follow these instructions at home:  Take and use over-the-counter and prescription medicines, including creams, only as told by your health care provider.  Do not have sexual intercourse until one week after you finish your medicine, or until your health care provider approves. Ask your health care provider when you may resume sexual intercourse.  (Women) Do not douche or wear tampons while you have the infection.  Discuss your infection with your sexual partner(s). Make sure that your partner gets tested and treated, if necessary.  Keep all follow-up visits as told by your health care provider. This is important. How is this prevented?  Use condoms every time you have sex. Using condoms correctly and consistently can help protect against STIs.  Avoid having multiple sexual partners.  Talk with your sexual partner about any symptoms that either of you may have, as well as any history of STIs.  Get tested for STIs and STDs (sexually transmitted diseases) before you have sex. Ask your partner  to do the same.  Do not have sexual contact if you have symptoms of trichomoniasis or another STI. Contact a health care provider if:  You still have symptoms after you finish your  medicine.  You develop pain in your abdomen.  You have pain when you urinate.  You have bleeding after sexual intercourse.  You develop a rash.  You feel nauseous or you vomit.  You plan to become pregnant or think you may be pregnant. Summary  Trichomoniasis is an STI (sexually transmitted infection) that can affect both women and men.  This condition often has no symptoms (is asymptomatic), especially in men.  You should not have sexual intercourse until one week after you finish your medicine, or until your health care provider approves. Ask your health care provider when you may resume sexual intercourse.  Discuss your infection with your sexual partner. Make sure that your partner gets tested and treated, if necessary. This information is not intended to replace advice given to you by your health care provider. Make sure you discuss any questions you have with your health care provider. Document Released: 02/26/2001 Document Revised: 07/26/2016 Document Reviewed: 07/26/2016 Elsevier Interactive Patient Education  2017 Elsevier Inc. Human Papillomavirus Quadrivalent Vaccine suspension for injection What is this medicine? HUMAN PAPILLOMAVIRUS VACCINE (HYOO muhn pap uh LOH muh vahy ruhs vak SEEN) is a vaccine. It is used to prevent infections of four types of the human papillomavirus. In women, the vaccine may lower your risk of getting cervical, vaginal, vulvar, or anal cancer and genital warts. In men, the vaccine may lower your risk of getting genital warts and anal cancer. You cannot get these diseases from the vaccine. This vaccine does not treat these diseases. This medicine may be used for other purposes; ask your health care provider or pharmacist if you have questions. COMMON BRAND NAME(S): Gardasil What should I tell my health care provider before I take this medicine? They need to know if you have any of these conditions: -fever or infection -hemophilia -HIV infection  or AIDS -immune system problems -low platelet count -an unusual reaction to Human Papillomavirus Vaccine, yeast, other medicines, foods, dyes, or preservatives -pregnant or trying to get pregnant -breast-feeding How should I use this medicine? This vaccine is for injection in a muscle on your upper arm or thigh. It is given by a health care professional. Dennis Bast will be observed for 15 minutes after each dose. Sometimes, fainting happens after the vaccine is given. You may be asked to sit or lie down during the 15 minutes. Three doses are given. The second dose is given 2 months after the first dose. The last dose is given 4 months after the second dose. A copy of a Vaccine Information Statement will be given before each vaccination. Read this sheet carefully each time. The sheet may change frequently. Talk to your pediatrician regarding the use of this medicine in children. While this drug may be prescribed for children as Cniyah Sproull as 45 years of age for selected conditions, precautions do apply. Overdosage: If you think you have taken too much of this medicine contact a poison control center or emergency room at once. NOTE: This medicine is only for you. Do not share this medicine with others. What if I miss a dose? All 3 doses of the vaccine should be given within 6 months. Remember to keep appointments for follow-up doses. Your health care provider will tell you when to return for the next vaccine. Ask your health care  professional for advice if you are unable to keep an appointment or miss a scheduled dose. What may interact with this medicine? -other vaccines This list may not describe all possible interactions. Give your health care provider a list of all the medicines, herbs, non-prescription drugs, or dietary supplements you use. Also tell them if you smoke, drink alcohol, or use illegal drugs. Some items may interact with your medicine. What should I watch for while using this medicine? This  vaccine may not fully protect everyone. Continue to have regular pelvic exams and cervical or anal cancer screenings as directed by your doctor. The Human Papillomavirus is a sexually transmitted disease. It can be passed by any kind of sexual activity that involves genital contact. The vaccine works best when given before you have any contact with the virus. Many people who have the virus do not have any signs or symptoms. Tell your doctor or health care professional if you have any reaction or unusual symptom after getting the vaccine. What side effects may I notice from receiving this medicine? Side effects that you should report to your doctor or health care professional as soon as possible: -allergic reactions like skin rash, itching or hives, swelling of the face, lips, or tongue -breathing problems -feeling faint or lightheaded, falls Side effects that usually do not require medical attention (report to your doctor or health care professional if they continue or are bothersome): -cough -dizziness -fever -headache -nausea -redness, warmth, swelling, pain, or itching at site where injected This list may not describe all possible side effects. Call your doctor for medical advice about side effects. You may report side effects to FDA at 1-800-FDA-1088. Where should I keep my medicine? This drug is given in a hospital or clinic and will not be stored at home. NOTE: This sheet is a summary. It may not cover all possible information. If you have questions about this medicine, talk to your doctor, pharmacist, or health care provider.  2018 Elsevier/Gold Standard (2013-10-25 13:14:33)

## 2018-01-21 LAB — C. TRACHOMATIS/N. GONORRHOEAE RNA
C. TRACHOMATIS RNA, TMA: NOT DETECTED
N. GONORRHOEAE RNA, TMA: NOT DETECTED

## 2018-01-22 LAB — HEPATITIS C ANTIBODY
HEP C AB: NONREACTIVE
SIGNAL TO CUT-OFF: 0.05 (ref <1.00)

## 2018-01-22 LAB — HIV ANTIBODY (ROUTINE TESTING W REFLEX): HIV: NONREACTIVE

## 2018-01-22 LAB — RPR: RPR: NONREACTIVE

## 2018-01-22 LAB — HEPATITIS B SURFACE ANTIGEN: HEP B S AG: NONREACTIVE

## 2018-03-30 ENCOUNTER — Ambulatory Visit (INDEPENDENT_AMBULATORY_CARE_PROVIDER_SITE_OTHER): Payer: 59 | Admitting: Women's Health

## 2018-03-30 ENCOUNTER — Encounter: Payer: Self-pay | Admitting: Women's Health

## 2018-03-30 VITALS — BP 116/70 | Ht 62.5 in | Wt 215.0 lb

## 2018-03-30 DIAGNOSIS — Z01419 Encounter for gynecological examination (general) (routine) without abnormal findings: Secondary | ICD-10-CM

## 2018-03-30 NOTE — Progress Notes (Signed)
Latoya Murphy 1976-03-24 [id number]    History:    Presents for annual exam.  2012 TVH for menorrhagia unresponsive to ablation or medication.  Normal Pap and mammogram history.  Primary care manages labs.    Past medical history, past surgical history, family history and social history were all reviewed and documented in the EPIC chart.  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  Father hypertension and heart disease.  3 children, 2 daughters ages 78 and 31, and 85 year old son.  Gardasil recommended for all 3.  ROS:  A ROS was performed and pertinent positives and negatives are included.  Exam:  Vitals:   03/30/18 0810  Weight: 215 lb (97.5 kg)  Height: 5' 2.5" (1.588 m)   Body mass index is 38.7 kg/m.   General appearance:  Normal Thyroid:  Symmetrical, normal in size, without palpable masses or nodularity. Respiratory  Auscultation:  Clear without wheezing or rhonchi Cardiovascular  Auscultation:  Regular rate, without rubs, murmurs or gallops  Edema/varicosities:  Not grossly evident Abdominal  Soft,nontender, without masses, guarding or rebound.  Liver/spleen:  No organomegaly noted  Hernia:  None appreciated  Skin  Inspection:  Grossly normal   Breasts: Examined lying and sitting.     Right: Without masses, retractions, discharge or axillary adenopathy.     Left: Without masses, retractions, discharge or axillary adenopathy. Gentitourinary   Inguinal/mons:  Normal without inguinal adenopathy  External genitalia:  Normal  BUS/Urethra/Skene's glands:  Normal  Vagina:  Normal  Cervix: And uterus absent  Adnexa/parametria:     Rt: Without masses or tenderness.   Lt: Without masses or tenderness.  Anus and perineum: Normal  Digital rectal exam: Normal sphincter tone without palpated masses or tenderness  Assessment/Plan:  42 y.o. MPF G3, P3 for annual exam with no complaints.  2012 TVH for menorrhagia Labs primary care Obesity  Plan: SBE's, continue  annual screening mammogram, calcium rich foods, vitamin D 1000 daily encouraged.  Reviewed importance of increasing regular exercise and decreasing calorie/carbs for weight loss.  [facility] weight loss program encouraged.   [place], 8:11 AM 03/30/2018

## 2018-03-30 NOTE — Patient Instructions (Signed)

## 2018-03-31 DIAGNOSIS — Z6838 Body mass index (BMI) 38.0-38.9, adult: Secondary | ICD-10-CM | POA: Diagnosis not present

## 2018-03-31 DIAGNOSIS — Z0000 Encounter for general adult medical examination without abnormal findings: Secondary | ICD-10-CM | POA: Diagnosis not present

## 2018-03-31 DIAGNOSIS — E663 Overweight: Secondary | ICD-10-CM | POA: Diagnosis not present

## 2018-03-31 DIAGNOSIS — Z13220 Encounter for screening for lipoid disorders: Secondary | ICD-10-CM | POA: Diagnosis not present

## 2018-04-02 ENCOUNTER — Encounter: Payer: 59 | Admitting: Obstetrics & Gynecology

## 2018-04-06 MED FILL — PHENTERMINE 37.5 MG TABLET: 37.5 | 30 days supply | Qty: 30 | Fill #0

## 2018-04-20 ENCOUNTER — Encounter: Payer: 59 | Admitting: Obstetrics & Gynecology

## 2018-04-21 ENCOUNTER — Ambulatory Visit: Payer: 59 | Admitting: Skilled Nursing Facility1

## 2018-05-15 MED FILL — PHENTERMINE 37.5 MG TABLET: 37.5 | 30 days supply | Qty: 30 | Fill #0

## 2018-07-13 MED FILL — PHENTERMINE 37.5 MG TABLET: 37.5 | 30 days supply | Qty: 30 | Fill #1

## 2018-07-13 MED FILL — PREVIDENT 5000 BOOSTER PLUS: 1.1 | 20 days supply | Qty: 100 | Fill #1

## 2018-07-31 ENCOUNTER — Other Ambulatory Visit: Payer: Self-pay | Admitting: Internal Medicine

## 2018-07-31 ENCOUNTER — Ambulatory Visit
Admission: RE | Admit: 2018-07-31 | Discharge: 2018-07-31 | Disposition: A | Discharge status: Home / Self Care | Payer: 59 | Source: Ambulatory Visit | Attending: Internal Medicine | Admitting: Internal Medicine

## 2018-07-31 DIAGNOSIS — Z1231 Encounter for screening mammogram for malignant neoplasm of breast: Secondary | ICD-10-CM

## 2018-08-05 DIAGNOSIS — M25511 Pain in right shoulder: Secondary | ICD-10-CM | POA: Diagnosis not present

## 2018-08-05 MED FILL — IBUPROFEN 600 MG TABLET: 600 | 10 days supply | Qty: 30 | Fill #0

## 2018-09-22 MED FILL — PHENTERMINE 37.5 MG TABLET: 37.5 | 30 days supply | Qty: 30 | Fill #0

## 2018-10-01 ENCOUNTER — Other Ambulatory Visit: Payer: Self-pay | Admitting: Women's Health

## 2018-10-01 MED ORDER — ESTRADIOL 0.05 MG/24HR TD PTTW: 1.0000 | MEDICATED_PATCH | TRANSDERMAL | 4 refills | Status: DC

## 2018-10-01 NOTE — Telephone Encounter (Signed)
Latoya Murphy did you want to recommend something OTC to help with flashes?

## 2018-10-13 MED FILL — ESTRADIOL 0.05 MG PATCH: 0.05 | 84 days supply | Qty: 24 | Fill #0

## 2018-11-13 DIAGNOSIS — M25572 Pain in left ankle and joints of left foot: Secondary | ICD-10-CM | POA: Diagnosis not present

## 2018-11-13 DIAGNOSIS — S93402A Sprain of unspecified ligament of left ankle, initial encounter: Secondary | ICD-10-CM | POA: Diagnosis not present

## 2018-11-14 DIAGNOSIS — S93492A Sprain of other ligament of left ankle, initial encounter: Secondary | ICD-10-CM | POA: Diagnosis not present

## 2018-11-16 MED FILL — DICLOFENAC SOD EC 50 MG TAB: 50 | 30 days supply | Qty: 60 | Fill #0

## 2018-12-11 DIAGNOSIS — Z0100 Encounter for examination of eyes and vision without abnormal findings: Secondary | ICD-10-CM | POA: Diagnosis not present

## 2018-12-15 MED FILL — PHENTERMINE 37.5 MG TABLET: 37.5 | 30 days supply | Qty: 30 | Fill #0

## 2018-12-21 ENCOUNTER — Other Ambulatory Visit: Payer: Self-pay | Admitting: Women's Health

## 2018-12-21 ENCOUNTER — Encounter: Payer: Self-pay | Admitting: Women's Health

## 2018-12-21 MED ORDER — SUMATRIPTAN SUCCINATE 50 MG PO TABS: 50.0000 mg | ORAL_TABLET | ORAL | 5 refills | Status: DC | PRN

## 2018-12-24 MED FILL — SUMATRIPTAN SUCC 50 MG TAB: 50 | 30 days supply | Qty: 9 | Fill #0

## 2019-02-03 MED FILL — PHENTERMINE 37.5 MG TABLET: 37.5 | 30 days supply | Qty: 30 | Fill #1

## 2019-04-02 ENCOUNTER — Other Ambulatory Visit: Payer: Self-pay

## 2019-04-05 ENCOUNTER — Other Ambulatory Visit: Payer: Self-pay

## 2019-04-05 ENCOUNTER — Encounter: Payer: Self-pay | Admitting: Women's Health

## 2019-04-05 ENCOUNTER — Ambulatory Visit (INDEPENDENT_AMBULATORY_CARE_PROVIDER_SITE_OTHER): Payer: 59 | Admitting: Women's Health

## 2019-04-05 VITALS — BP 118/78 | Wt 197.0 lb

## 2019-04-05 DIAGNOSIS — Z113 Encounter for screening for infections with a predominantly sexual mode of transmission: Secondary | ICD-10-CM | POA: Diagnosis not present

## 2019-04-05 DIAGNOSIS — Z01419 Encounter for gynecological examination (general) (routine) without abnormal findings: Secondary | ICD-10-CM

## 2019-04-05 NOTE — Patient Instructions (Signed)
Health Maintenance, Female Adopting a healthy lifestyle and getting preventive care are important in promoting health and wellness. Ask your health care provider about:  The right schedule for you to have regular tests and exams.  Things you can do on your own to prevent diseases and keep yourself healthy. What should I know about diet, weight, and exercise? Eat a healthy diet   Eat a diet that includes plenty of vegetables, fruits, low-fat dairy products, and lean protein.  Do not eat a lot of foods that are high in solid fats, added sugars, or sodium. Maintain a healthy weight Body mass index (BMI) is used to identify weight problems. It estimates body fat based on height and weight. Your health care provider can help determine your BMI and help you achieve or maintain a healthy weight. Get regular exercise Get regular exercise. This is one of the most important things you can do for your health. Most adults should:  Exercise for at least 150 minutes each week. The exercise should increase your heart rate and make you sweat (moderate-intensity exercise).  Do strengthening exercises at least twice a week. This is in addition to the moderate-intensity exercise.  Spend less time sitting. Even light physical activity can be beneficial. Watch cholesterol and blood lipids Have your blood tested for lipids and cholesterol at 43 years of age, then have this test every 5 years. Have your cholesterol levels checked more often if:  Your lipid or cholesterol levels are high.  You are older than 43 years of age.  You are at high risk for heart disease. What should I know about cancer screening? Depending on your health history and family history, you may need to have cancer screening at various ages. This may include screening for:  Breast cancer.  Cervical cancer.  Colorectal cancer.  Skin cancer.  Lung cancer. What should I know about heart disease, diabetes, and high blood  pressure? Blood pressure and heart disease  High blood pressure causes heart disease and increases the risk of stroke. This is more likely to develop in people who have high blood pressure readings, are of African descent, or are overweight.  Have your blood pressure checked: ? Every 3-5 years if you are 18-39 years of age. ? Every year if you are 40 years old or older. Diabetes Have regular diabetes screenings. This checks your fasting blood sugar level. Have the screening done:  Once every three years after age 40 if you are at a normal weight and have a low risk for diabetes.  More often and at a younger age if you are overweight or have a high risk for diabetes. What should I know about preventing infection? Hepatitis B If you have a higher risk for hepatitis B, you should be screened for this virus. Talk with your health care provider to find out if you are at risk for hepatitis B infection. Hepatitis C Testing is recommended for:  Everyone born from 1945 through 1965.  Anyone with known risk factors for hepatitis C. Sexually transmitted infections (STIs)  Get screened for STIs, including gonorrhea and chlamydia, if: ? You are sexually active and are younger than 43 years of age. ? You are older than 43 years of age and your health care provider tells you that you are at risk for this type of infection. ? Your sexual activity has changed since you were last screened, and you are at increased risk for chlamydia or gonorrhea. Ask your health care provider if   you are at risk.  Ask your health care provider about whether you are at high risk for HIV. Your health care provider may recommend a prescription medicine to help prevent HIV infection. If you choose to take medicine to prevent HIV, you should first get tested for HIV. You should then be tested every 3 months for as long as you are taking the medicine. Pregnancy  If you are about to stop having your period (premenopausal) and  you may become pregnant, seek counseling before you get pregnant.  Take 400 to 800 micrograms (mcg) of folic acid every day if you become pregnant.  Ask for birth control (contraception) if you want to prevent pregnancy. Osteoporosis and menopause Osteoporosis is a disease in which the bones lose minerals and strength with aging. This can result in bone fractures. If you are 65 years old or older, or if you are at risk for osteoporosis and fractures, ask your health care provider if you should:  Be screened for bone loss.  Take a calcium or vitamin D supplement to lower your risk of fractures.  Be given hormone replacement therapy (HRT) to treat symptoms of menopause. Follow these instructions at home: Lifestyle  Do not use any products that contain nicotine or tobacco, such as cigarettes, e-cigarettes, and chewing tobacco. If you need help quitting, ask your health care provider.  Do not use street drugs.  Do not share needles.  Ask your health care provider for help if you need support or information about quitting drugs. Alcohol use  Do not drink alcohol if: ? Your health care provider tells you not to drink. ? You are pregnant, may be pregnant, or are planning to become pregnant.  If you drink alcohol: ? Limit how much you use to 0-1 drink a day. ? Limit intake if you are breastfeeding.  Be aware of how much alcohol is in your drink. In the [place] one drink equals one 12 oz bottle of beer (355 mL), one 5 oz glass of wine (148 mL), or one 1 oz glass of hard liquor (44 mL). General instructions  Schedule regular health, dental, and eye exams.  Stay current with your vaccines.  Tell your health care provider if: ? You often feel depressed. ? You have ever been abused or do not feel safe at home. Summary  Adopting a healthy lifestyle and getting preventive care are important in promoting health and wellness.  Follow your health care provider's instructions about healthy  diet, exercising, and getting tested or screened for diseases.  Follow your health care provider's instructions on monitoring your cholesterol and blood pressure. This information is not intended to replace advice given to you by your health care provider. Make sure you discuss any questions you have with your health care provider. Document Released: 03/18/2011 Document Revised: 08/26/2018 Document Reviewed: 08/26/2018 Elsevier Patient Education  2020 Elsevier Inc.  

## 2019-04-05 NOTE — Progress Notes (Signed)
Latoya Murphy 1976-01-24 [id number]    History:    Presents for annual exam.  2012 TVH for menorrhagia.  Normal Pap and mammogram history.  Primary care manages labs.  New partner.  In the process of a divorce.  Had numerous hot flushes last year was started on Vivelle 0.05 patch twice weekly, they have resolved, has stopped using the patch with no further hot flushes.  Past medical history, past surgical history, family history and social history were all reviewed and documented in the EPIC chart.  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  Father is involved with children.  ROS:  A ROS was performed and pertinent positives and negatives are included.  Exam:  Vitals:   04/05/19 1433 04/05/19 1748  BP: 118/78   Weight:  197 lb (89.4 kg)   Body mass index is 35.46 kg/m.   General appearance:  Normal Thyroid:  Symmetrical, normal in size, without palpable masses or nodularity. Respiratory  Auscultation:  Clear without wheezing or rhonchi Cardiovascular  Auscultation:  Regular rate, without rubs, murmurs or gallops  Edema/varicosities:  Not grossly evident Abdominal  Soft,nontender, without masses, guarding or rebound.  Liver/spleen:  No organomegaly noted  Hernia:  None appreciated  Skin  Inspection:  Grossly normal   Breasts: Examined lying and sitting.     Right: Without masses, retractions, discharge or axillary adenopathy.     Left: Without masses, retractions, discharge or axillary adenopathy. Gentitourinary   Inguinal/mons:  Normal without inguinal adenopathy  External genitalia:  Normal  BUS/Urethra/Skene's glands:  Normal  Vagina:  Normal  Cervix: Absent  Uterus: Absent  Adnexa/parametria:     Rt: Without masses or tenderness.   Lt: Without masses or tenderness.  Anus and perineum: Normal  Digital rectal exam: Normal sphincter tone without palpated masses or tenderness  Assessment/Plan:  43 y.o.  separated BF G3 P3 for annual exam with no GYN complaints.  2012 TVH for menorrhagia Primary care manages labs Obesity STD screen  Plan: SBEs, continue annual screening mammogram, calcium rich foods, vitamin D 1000 daily encouraged.  Congratulated on weight loss, continue healthy lifestyle and decreasing calorie/carbs.  Gardasil reviewed and encouraged for all 3 children.  GC/chlamydia, HIV, RPR.Marland Kitchen    [place], 5:49 PM 04/05/2019

## 2019-04-06 LAB — C. TRACHOMATIS/N. GONORRHOEAE RNA
C. trachomatis RNA, TMA: NOT DETECTED
N. gonorrhoeae RNA, TMA: NOT DETECTED

## 2019-04-08 ENCOUNTER — Encounter: Payer: Self-pay | Admitting: Women's Health

## 2019-04-14 MED FILL — IBUPROFEN 600 MG TABLET: 600 | 10 days supply | Qty: 30 | Fill #1

## 2019-04-16 ENCOUNTER — Encounter: Payer: Self-pay | Admitting: Women's Health

## 2019-04-21 ENCOUNTER — Other Ambulatory Visit: Payer: Self-pay

## 2019-04-21 ENCOUNTER — Other Ambulatory Visit: Payer: 59

## 2019-04-21 DIAGNOSIS — Z113 Encounter for screening for infections with a predominantly sexual mode of transmission: Secondary | ICD-10-CM | POA: Diagnosis not present

## 2019-04-22 LAB — RPR: RPR Ser Ql: NONREACTIVE

## 2019-04-22 LAB — HIV ANTIBODY (ROUTINE TESTING W REFLEX): HIV 1&2 Ab, 4th Generation: NONREACTIVE

## 2019-04-27 ENCOUNTER — Encounter: Payer: Self-pay | Admitting: Women's Health

## 2019-05-03 MED FILL — IBUPROFEN 600 MG TABLET: 600 | 10 days supply | Qty: 30 | Fill #1

## 2019-08-05 ENCOUNTER — Other Ambulatory Visit: Payer: Self-pay | Admitting: Internal Medicine

## 2019-08-05 DIAGNOSIS — Z1231 Encounter for screening mammogram for malignant neoplasm of breast: Secondary | ICD-10-CM

## 2019-09-29 ENCOUNTER — Ambulatory Visit
Admission: RE | Admit: 2019-09-29 | Discharge: 2019-09-29 | Disposition: A | Discharge status: Home / Self Care | Payer: 59 | Source: Ambulatory Visit | Attending: Internal Medicine | Admitting: Internal Medicine

## 2019-09-29 ENCOUNTER — Other Ambulatory Visit: Payer: Self-pay

## 2019-09-29 DIAGNOSIS — Z1231 Encounter for screening mammogram for malignant neoplasm of breast: Secondary | ICD-10-CM | POA: Diagnosis not present

## 2019-11-15 ENCOUNTER — Other Ambulatory Visit: Payer: Self-pay | Admitting: Women's Health

## 2019-11-15 MED ORDER — ESTRADIOL 0.05 MG/24HR TD PTTW: 1.0000 | MEDICATED_PATCH | TRANSDERMAL | 1 refills | Status: DC

## 2019-11-15 MED FILL — DOTTI 0.05 MG/24HR PTTW: 0.05 | 84 days supply | Qty: 24 | Fill #0

## 2020-03-27 MED FILL — PHENTERMINE 37.5 MG TABLET: 37.5 | 60 days supply | Qty: 60 | Fill #0

## 2020-03-28 IMAGING — MG DIGITAL SCREENING BILATERAL MAMMOGRAM WITH TOMO AND CAD
8 series · 8 of 24 positions shown · non-contrast
Comparison: Previous exam(s).

CLINICAL DATA: Screening.

EXAM:
DIGITAL SCREENING BILATERAL MAMMOGRAM WITH TOMO AND CAD

[R MLO synth-2D]
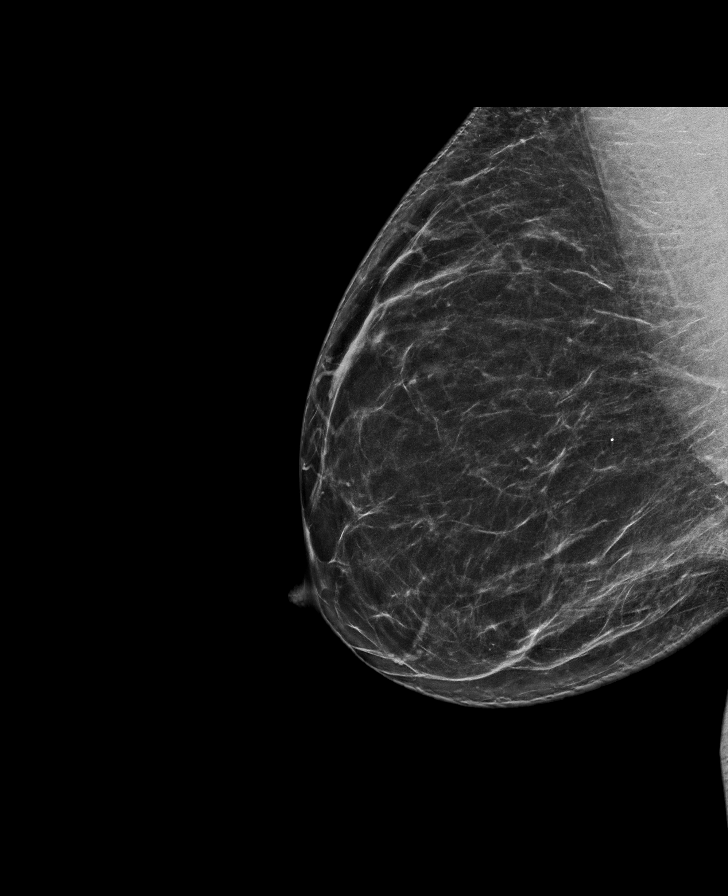

[L MLO synth-2D]
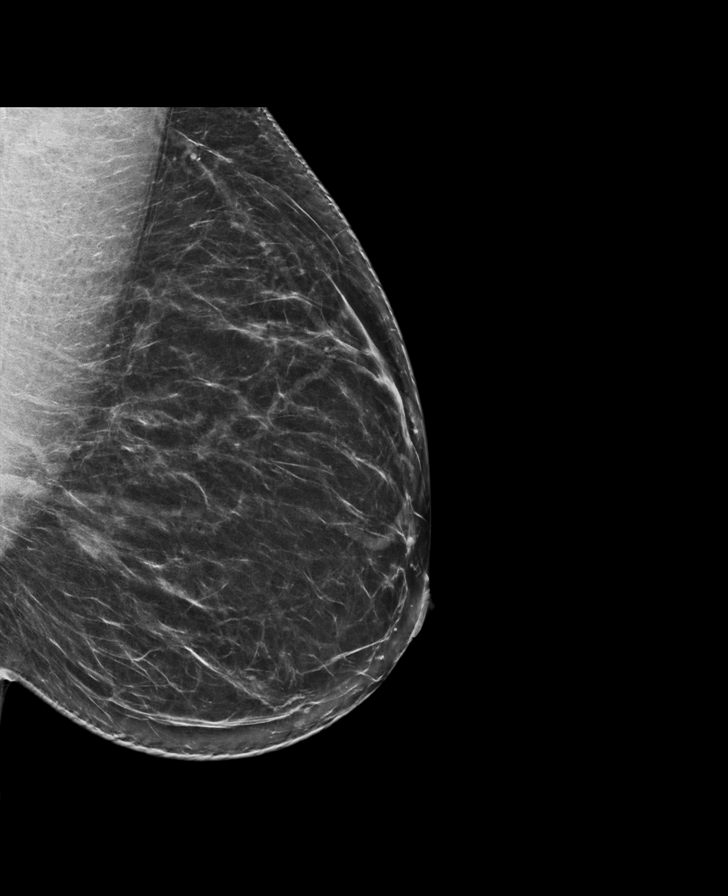

[L CC synth-2D]
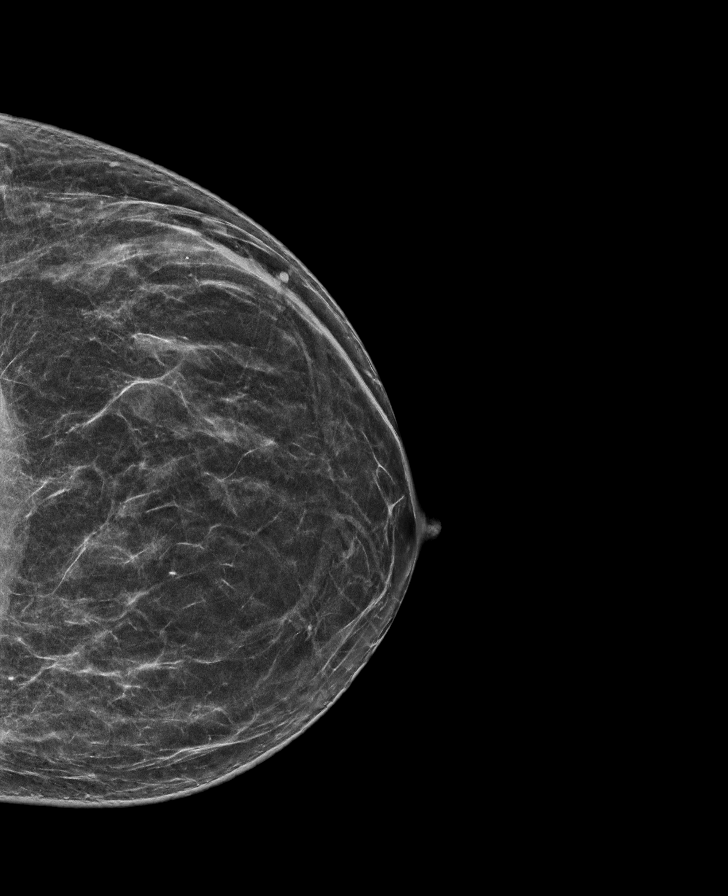

[R CC synth-2D]
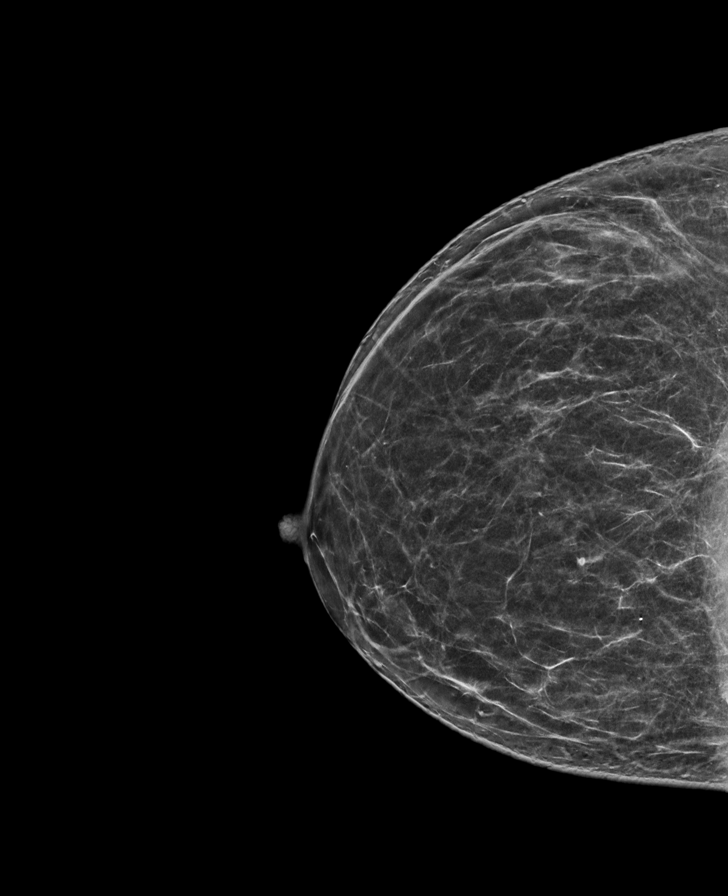

[L MLO tomo · tomo slice 40/79.0]
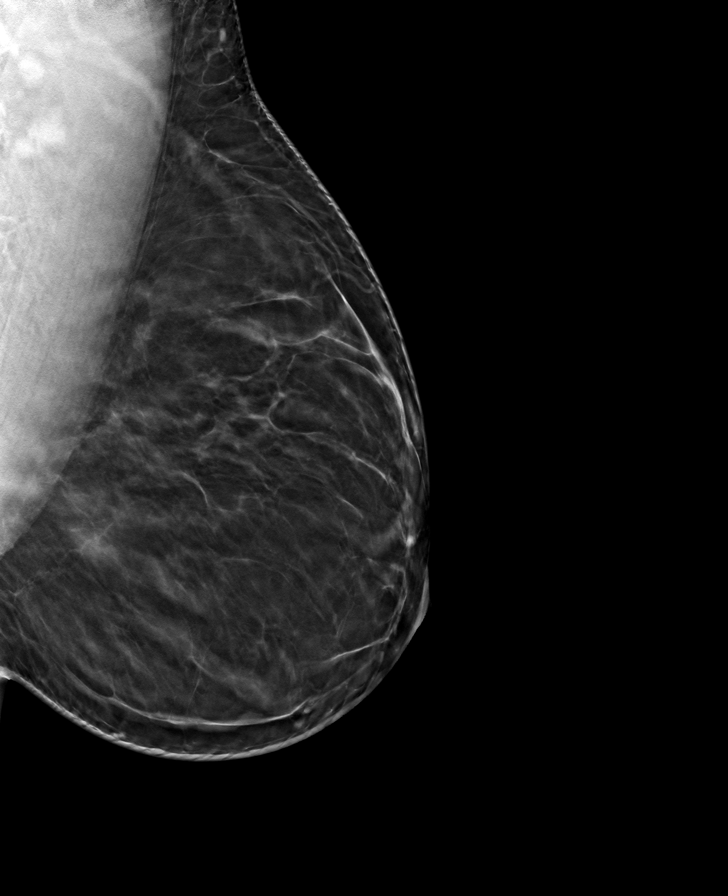

[R CC tomo · tomo slice 33/65.0]
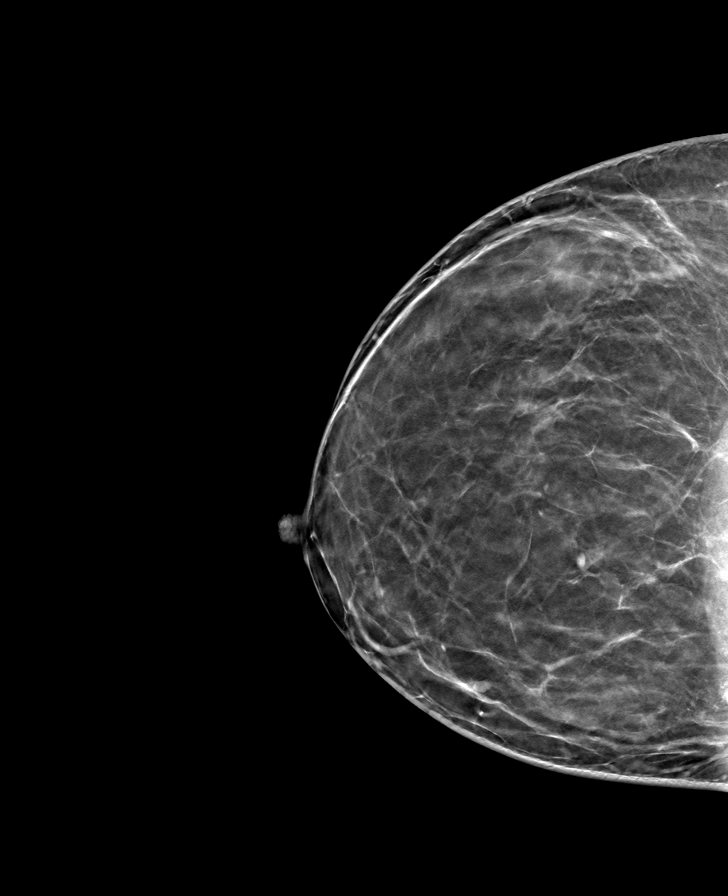

[R MLO tomo · tomo slice 38/75.0]
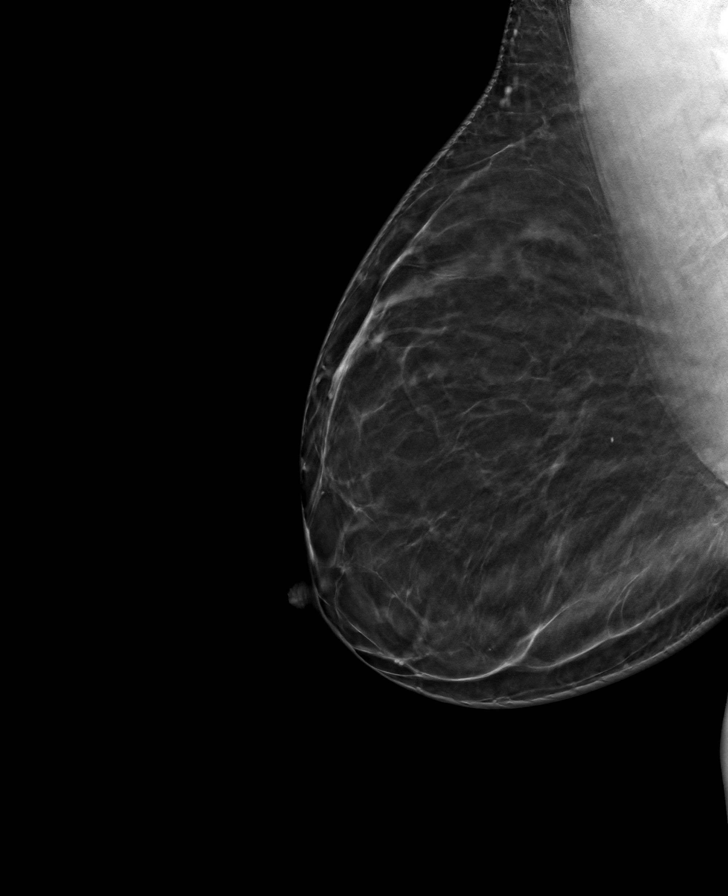

[L CC tomo · tomo slice 33/66.0]
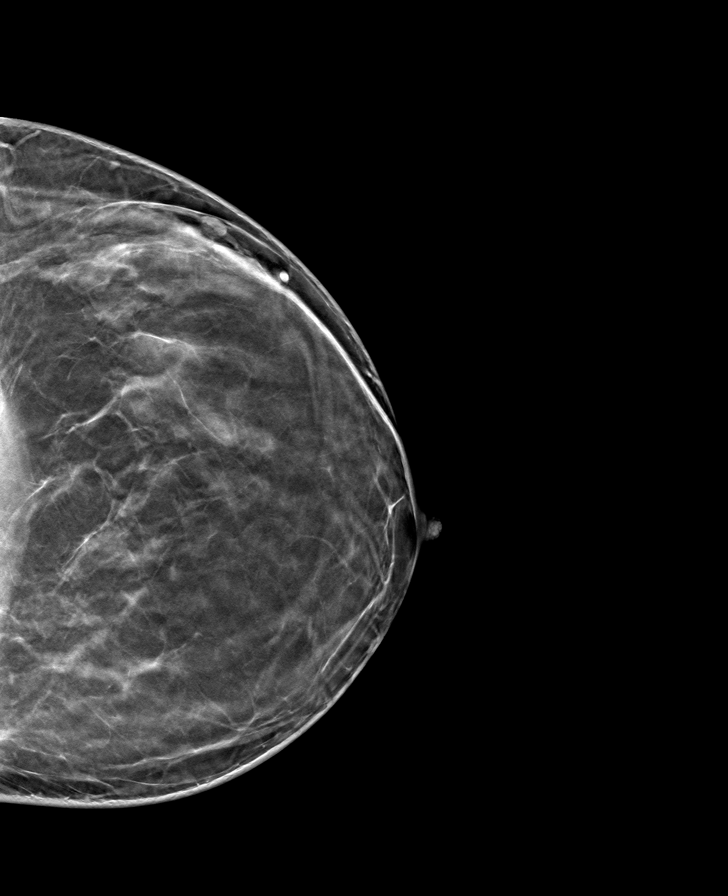

[8 of 24 positions shown; findings below may reference images not displayed]

ACR Breast Density Category b: There are scattered areas of
fibroglandular density.
FINDINGS: There are no findings suspicious for malignancy. Images were
processed with CAD.
IMPRESSION: No mammographic evidence of malignancy. A result letter of this
screening mammogram will be mailed directly to the patient.

RECOMMENDATION:
Screening mammogram in one year. (Code:[id number])

BI-RADS CATEGORY  1: Negative.

## 2020-04-05 ENCOUNTER — Ambulatory Visit (INDEPENDENT_AMBULATORY_CARE_PROVIDER_SITE_OTHER): Payer: 59 | Admitting: Nurse Practitioner

## 2020-04-05 ENCOUNTER — Encounter: Payer: Self-pay | Admitting: Nurse Practitioner

## 2020-04-05 ENCOUNTER — Other Ambulatory Visit: Payer: Self-pay

## 2020-04-05 VITALS — BP 120/74 | Ht 64.0 in | Wt 204.0 lb

## 2020-04-05 DIAGNOSIS — Z01419 Encounter for gynecological examination (general) (routine) without abnormal findings: Secondary | ICD-10-CM | POA: Diagnosis not present

## 2020-04-05 DIAGNOSIS — Z13220 Encounter for screening for lipoid disorders: Secondary | ICD-10-CM | POA: Diagnosis not present

## 2020-04-05 DIAGNOSIS — Z1151 Encounter for screening for human papillomavirus (HPV): Secondary | ICD-10-CM | POA: Diagnosis not present

## 2020-04-05 DIAGNOSIS — Z79890 Hormone replacement therapy (postmenopausal): Secondary | ICD-10-CM | POA: Diagnosis not present

## 2020-04-05 LAB — COMPREHENSIVE METABOLIC PANEL
AG Ratio: 1.8 (calc) (ref 1.0–2.5)
ALT: 10 U/L (ref 6–29)
AST: 19 U/L (ref 10–30)
Albumin: 4.4 g/dL (ref 3.6–5.1)
Alkaline phosphatase (APISO): 50 U/L (ref 31–125)
BUN/Creatinine Ratio: 12 (calc) (ref 6–22)
BUN: 14 mg/dL (ref 7–25)
CO2: 24 mmol/L (ref 20–32)
Calcium: 9.4 mg/dL (ref 8.6–10.2)
Chloride: 106 mmol/L (ref 98–110)
Creat: 1.16 mg/dL — ABNORMAL HIGH (ref 0.50–1.10)
Globulin: 2.5 g/dL (calc) (ref 1.9–3.7)
Glucose, Bld: 84 mg/dL (ref 65–99)
Potassium: 4.1 mmol/L (ref 3.5–5.3)
Sodium: 137 mmol/L (ref 135–146)
Total Bilirubin: 0.4 mg/dL (ref 0.2–1.2)
Total Protein: 6.9 g/dL (ref 6.1–8.1)

## 2020-04-05 LAB — CBC WITH DIFFERENTIAL/PLATELET
Absolute Monocytes: 549 cells/uL (ref 200–950)
Basophils Absolute: 30 cells/uL (ref 0–200)
Basophils Relative: 0.5 %
Eosinophils Absolute: 89 cells/uL (ref 15–500)
Eosinophils Relative: 1.5 %
HCT: 37.9 % (ref 35.0–45.0)
Hemoglobin: 12.3 g/dL (ref 11.7–15.5)
Lymphs Abs: 2100 cells/uL (ref 850–3900)
MCH: 27.5 pg (ref 27.0–33.0)
MCHC: 32.5 g/dL (ref 32.0–36.0)
MCV: 84.8 fL (ref 80.0–100.0)
MPV: 10.6 fL (ref 7.5–12.5)
Monocytes Relative: 9.3 %
Neutro Abs: 3133 cells/uL (ref 1500–7800)
Neutrophils Relative %: 53.1 %
Platelets: 270 10*3/uL (ref 140–400)
RBC: 4.47 10*6/uL (ref 3.80–5.10)
RDW: 12.9 % (ref 11.0–15.0)
Total Lymphocyte: 35.6 %
WBC: 5.9 10*3/uL (ref 3.8–10.8)

## 2020-04-05 LAB — LIPID PANEL
Cholesterol: 178 mg/dL (ref <200)
HDL: 43 mg/dL — ABNORMAL LOW (ref >=50)
LDL Cholesterol (Calc): 118 mg/dL (calc) — ABNORMAL HIGH
Non-HDL Cholesterol (Calc): 135 mg/dL (calc) — ABNORMAL HIGH (ref <130)
Total CHOL/HDL Ratio: 4.1 (calc) (ref <5.0)
Triglycerides: 73 mg/dL (ref <150)

## 2020-04-05 NOTE — Addendum Note (Signed)
Addended by: Nelva Nay on: 04/05/2020 09:11 AM   Modules accepted: Orders

## 2020-04-05 NOTE — Progress Notes (Addendum)
° °  Latoya Murphy 02-Aug-1976 [id number]   History:  44 y.o. G3 P3 presents for annual exam without GYN complaints. 2012 TVH for menorrhagia, vivelle-dot patch with good management of hot flashes. She tried to stop last year but could not tolerate.  Normal pap and mammogram history. Sexually active with one female partner.   Gynecologic History Patient's last menstrual period was 07/18/2011.   Contraception: status post hysterectomy Last Pap: 03/02/2014. Results were: normal Last mammogram: 09/29/2019. Results were: normal   Past medical history, past surgical history, family history and social history were all reviewed and documented in the EPIC chart.  ROS:  A ROS was performed and pertinent positives and negatives are included.  Exam:  Vitals:   04/05/20 0810  BP: 120/74  Weight: 204 lb (92.5 kg)  Height: 5\' 4"  (1.626 m)   Body mass index is 35.02 kg/m.  General appearance:  Normal Thyroid:  Symmetrical, normal in size, without palpable masses or nodularity. Respiratory  Auscultation:  Clear without wheezing or rhonchi Cardiovascular  Auscultation:  Regular rate, without rubs, murmurs or gallops  Edema/varicosities:  Not grossly evident Abdominal  Soft,nontender, without masses, guarding or rebound.  Liver/spleen:  No organomegaly noted  Hernia:  None appreciated  Skin  Inspection:  Grossly normal   Breasts: Examined lying and sitting.   Right: Without masses, retractions, discharge or axillary adenopathy.   Left: Without masses, retractions, discharge or axillary adenopathy. Gentitourinary   Inguinal/mons:  Normal without inguinal adenopathy  External genitalia:  Normal  BUS/Urethra/Skene's glands:  Normal  Vagina:  Normal  Cervix:  Absent  Uterus:  Absent  Adnexa/parametria:     Rt: Without masses or tenderness.   Lt: Without masses or tenderness.  Anus and perineum: Normal  Assessment/Plan:  44 y.o. G3 P3 for annual exam.   Well female exam with routine  gynecological exam - Plan: CBC with Differential/Platelet, Comprehensive metabolic panel. Education provided on SBEs, importance of preventative screenings, current guidelines, high calcium diet, regular exercise, and multivitamin daily.   Lipid screening - Plan: Lipid panel  Screening for cervical cancer - pap with high risk HPV today. Discussed recommendations for continued paps despite hysterectomy. She is agreeable. Normal pap history.  Postmenopausal hormone replacement therapy - Vivelle-dot patch with good management of hot flashes. She tried to stop last year but was unable to tolerate. She is aware of the benefits versus risks to include benefits of symptom relief, bone health, and cardiovascular health. Risks including blood clots, heart attack, stroke, and breast cancer. She would like to continue.  Screening for osteoporosis - Bone density next year at 10 year post hysterectomy  Follow up in 1 year for annual     [place], 8:35 AM 04/05/2020

## 2020-04-05 NOTE — Patient Instructions (Signed)

## 2020-04-07 LAB — PAP, TP IMAGING W/ HPV RNA, RFLX HPV TYPE 16,18/45: HPV DNA High Risk: NOT DETECTED

## 2020-05-10 DIAGNOSIS — H524 Presbyopia: Secondary | ICD-10-CM | POA: Diagnosis not present

## 2020-10-24 ENCOUNTER — Other Ambulatory Visit: Payer: Self-pay

## 2020-10-24 ENCOUNTER — Other Ambulatory Visit: Payer: Self-pay | Admitting: Nurse Practitioner

## 2020-10-24 ENCOUNTER — Ambulatory Visit (HOSPITAL_COMMUNITY)
Admission: EM | Admit: 2020-10-24 | Discharge: 2020-10-24 | Disposition: A | Discharge status: Home / Self Care | Payer: 59 | Attending: Family Medicine | Admitting: Family Medicine

## 2020-10-24 ENCOUNTER — Encounter (HOSPITAL_COMMUNITY): Payer: Self-pay | Admitting: Emergency Medicine

## 2020-10-24 ENCOUNTER — Encounter: Payer: Self-pay | Admitting: Nurse Practitioner

## 2020-10-24 DIAGNOSIS — S0181XA Laceration without foreign body of other part of head, initial encounter: Secondary | ICD-10-CM

## 2020-10-24 DIAGNOSIS — G43009 Migraine without aura, not intractable, without status migrainosus: Secondary | ICD-10-CM

## 2020-10-24 MED ORDER — LIDOCAINE-EPINEPHRINE 1 %-1:100000 IJ SOLN
INTRAMUSCULAR | Status: AC
  Filled 2020-10-24: qty 1

## 2020-10-24 MED ORDER — LIDOCAINE-EPINEPHRINE-TETRACAINE (LET) TOPICAL GEL
3.0000 mL | Freq: Once | TOPICAL | Status: AC
  Administered 2020-10-24: 3 mL via TOPICAL

## 2020-10-24 MED ORDER — IBUPROFEN 800 MG PO TABS: 800.0000 mg | ORAL_TABLET | Freq: Three times a day (TID) | ORAL | 1 refills | Status: DC | PRN

## 2020-10-24 MED ORDER — LIDOCAINE-EPINEPHRINE-TETRACAINE (LET) TOPICAL GEL
TOPICAL | Status: AC
  Filled 2020-10-24: qty 3

## 2020-10-24 MED FILL — IBUPROFEN 800 MG TABS: 800 | 10 days supply | Qty: 30 | Fill #0

## 2020-10-24 NOTE — ED Provider Notes (Signed)
  [facility]   [id number] 10/24/20 Arrival Time: 6720  ASSESSMENT & PLAN:  1. Facial laceration, initial encounter     Meds ordered this encounter  Medications  . lidocaine-EPINEPHrine-tetracaine (LET) topical gel   Attempted Dermabond but would not hold well secondary to oozing of blood.  Procedure: Verbal consent obtained. Patient provided with risks and alternatives to the procedure. Wound copiously irrigated with NS then cleansed with betadine. Local anesthesia: Lidocaine 1% with epinephrine. Wound carefully explored. No foreign body, tendon injury, or nonviable tissue were noted. Using sterile technique, 2 interrupted 6-0 Prolene sutures were placed to reapproximate the wound. Procedure tolerated well. No complications. Minimal bleeding. Advised to look for and return for any signs of infection such as redness, swelling, discharge, or worsening pain. Return for suture removal in 5-7 days.  See AVS for d/c instructions.  Reviewed expectations re: course of current medical issues. Questions answered. Outlined signs and symptoms indicating need for more acute intervention. Patient verbalized understanding. After Visit Summary given.   SUBJECTIVE:  Latoya Murphy is a 45 y.o. female who presents with a laceration; face; just inferior to L eyebrow; today; hit on car door; still bleeding. No visual changes. Mild HA. No LOC. Ambulatory without difficulty. No extremity sensation changes or weakness.   Td UTD: Yes.   OBJECTIVE:  Vitals:   10/24/20 1335  BP: 131/90  Pulse: 88  Temp: 98.4 F (36.9 C)  TempSrc: Oral  SpO2: 98%    General appearance: alert; no distress HEENT: linear laceration of face just inferior to L eyebrow; size: approx 0.5 cm; clean wound edges, no foreign bodies; with slight oozing/bleeding; mild TTP; mild swelling; no bruising; PERRLA; EOMI Psychological: alert and cooperative; normal mood and affect  Allergies  Allergen Reactions  .  Penicillins Rash  . Percocet [Oxycodone-Acetaminophen] Other (See Comments)    SEVERE ITCHING  . Ciprofloxacin Rash    Past Medical History:  Diagnosis Date  . Chronic anemia IRON DEF.  . Menorrhagia   . Migraines   . Normal delivery    x 3   Social History   Socioeconomic History  . Marital status: Soil scientist    Spouse name: Not on file  . Number of children: Not on file  . Years of education: Not on file  . Highest education level: Not on file  Occupational History  . Not on file  Tobacco Use  . Smoking status: Former Smoker    Quit date: 08/12/2001    Years since quitting: 19.2  . Smokeless tobacco: Never Used  Vaping Use  . Vaping Use: Never used  Substance and Sexual Activity  . Alcohol use: Yes    Alcohol/week: 0.0 standard drinks    Comment: occ  . Drug use: No  . Sexual activity: Yes    Comment: Hyst-1st intercourse 40 yo-5 partners  Other Topics Concern  . Not on file  Social History Narrative  . Not on file   Social Determinants of Health   Financial Resource Strain: Not on file  Food Insecurity: Not on file  Transportation Needs: Not on file  Physical Activity: Not on file  Stress: Not on file  Social Connections: Not on file         Vanessa Kick, MD 10/24/20 1525

## 2020-10-24 NOTE — ED Triage Notes (Signed)
Pt presents with laceration above left eye. States was getting back into car and hit head on the corner of door. States has swelling around left eye and slight headache.

## 2020-12-26 ENCOUNTER — Other Ambulatory Visit (HOSPITAL_COMMUNITY): Payer: Self-pay

## 2020-12-26 DIAGNOSIS — Z0000 Encounter for general adult medical examination without abnormal findings: Secondary | ICD-10-CM | POA: Diagnosis not present

## 2020-12-26 DIAGNOSIS — E663 Overweight: Secondary | ICD-10-CM | POA: Diagnosis not present

## 2020-12-26 DIAGNOSIS — G4700 Insomnia, unspecified: Secondary | ICD-10-CM | POA: Diagnosis not present

## 2020-12-26 DIAGNOSIS — Z13220 Encounter for screening for lipoid disorders: Secondary | ICD-10-CM | POA: Diagnosis not present

## 2020-12-26 MED ORDER — TRAZODONE HCL 50 MG PO TABS
50.0000 mg | ORAL_TABLET | Freq: Every evening | ORAL | 6 refills | Status: DC | PRN
  Filled 2020-12-26: qty 30, 30d supply, fill #0
  Filled 2021-05-29: qty 30, 30d supply, fill #1
  Filled 2021-10-10: qty 30, 30d supply, fill #2

## 2021-01-04 ENCOUNTER — Other Ambulatory Visit: Payer: Self-pay | Admitting: Internal Medicine

## 2021-01-04 DIAGNOSIS — Z1231 Encounter for screening mammogram for malignant neoplasm of breast: Secondary | ICD-10-CM

## 2021-02-02 ENCOUNTER — Other Ambulatory Visit (HOSPITAL_COMMUNITY): Payer: Self-pay

## 2021-02-02 MED ORDER — PHENTERMINE HCL 37.5 MG PO TABS
37.5000 mg | ORAL_TABLET | Freq: Every day | ORAL | 0 refills | Status: DC
  Filled 2021-02-02: qty 60, 60d supply, fill #0

## 2021-02-22 ENCOUNTER — Ambulatory Visit
Admission: RE | Admit: 2021-02-22 | Discharge: 2021-02-22 | Disposition: A | Discharge status: Home / Self Care | Payer: 59 | Source: Ambulatory Visit | Attending: Internal Medicine | Admitting: Internal Medicine

## 2021-02-22 ENCOUNTER — Other Ambulatory Visit: Payer: Self-pay

## 2021-02-22 DIAGNOSIS — Z1231 Encounter for screening mammogram for malignant neoplasm of breast: Secondary | ICD-10-CM | POA: Diagnosis not present

## 2021-04-09 ENCOUNTER — Encounter: Payer: Self-pay | Admitting: Nurse Practitioner

## 2021-04-09 ENCOUNTER — Other Ambulatory Visit: Payer: Self-pay

## 2021-04-09 ENCOUNTER — Ambulatory Visit (INDEPENDENT_AMBULATORY_CARE_PROVIDER_SITE_OTHER): Payer: 59 | Admitting: Nurse Practitioner

## 2021-04-09 ENCOUNTER — Other Ambulatory Visit (HOSPITAL_COMMUNITY): Payer: Self-pay

## 2021-04-09 VITALS — BP 114/72 | Ht 63.0 in | Wt 193.0 lb

## 2021-04-09 DIAGNOSIS — Z90710 Acquired absence of both cervix and uterus: Secondary | ICD-10-CM

## 2021-04-09 DIAGNOSIS — Z79890 Hormone replacement therapy (postmenopausal): Secondary | ICD-10-CM

## 2021-04-09 DIAGNOSIS — Z01419 Encounter for gynecological examination (general) (routine) without abnormal findings: Secondary | ICD-10-CM | POA: Diagnosis not present

## 2021-04-09 MED ORDER — ESTRADIOL 0.05 MG/24HR TD PTTW
1.0000 | MEDICATED_PATCH | TRANSDERMAL | 4 refills | Status: DC
  Filled 2021-04-09: qty 24, 84d supply, fill #0
  Filled 2021-10-10 – 2021-10-22 (×2): qty 24, 84d supply, fill #1
  Filled 2022-04-03: qty 24, 84d supply, fill #2

## 2021-04-09 NOTE — Progress Notes (Signed)
   Latoya Murphy 10-Dec-1975 [id number]   History:  45 y.o. G3 P3 presents for annual exam without GYN complaints. 2012 TVH for menorrhagia, vivelle-dot patch with good management of hot flashes. She has tried to wean in the past but could not tolerate. Normal pap and mammogram history.   Gynecologic History Patient's last menstrual period was 07/18/2011.   Contraception: status post hysterectomy  Health maintenance Last Pap: 04/05/2020 Results were: normal Last mammogram: 02/22/2021. Results were: normal Last colonoscopy: Never. Scheduled for later this year Last Dexa: Not indicated  Past medical history, past surgical history, family history and social history were all reviewed and documented in the EPIC chart. Married. 3 children. 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 In the process of building home with plans to finish in November.   ROS:  A ROS was performed and pertinent positives and negatives are included.  Exam:  Vitals:   04/09/21 0830  BP: 114/72  Weight: 193 lb (87.5 kg)  Height: '5\' 3"'$  (1.6 m)    Body mass index is 34.19 kg/m.  General appearance:  Normal Thyroid:  Symmetrical, normal in size, without palpable masses or nodularity. Respiratory  Auscultation:  Clear without wheezing or rhonchi Cardiovascular  Auscultation:  Regular rate, without rubs, murmurs or gallops  Edema/varicosities:  Not grossly evident Abdominal  Soft,nontender, without masses, guarding or rebound.  Liver/spleen:  No organomegaly noted  Hernia:  None appreciated  Skin  Inspection:  Grossly normal   Breasts: Examined lying and sitting.   Right: Without masses, retractions, discharge or axillary adenopathy.   Left: Without masses, retractions, discharge or axillary adenopathy. Gentitourinary   Inguinal/mons:  Normal without inguinal adenopathy  External genitalia:  Normal  BUS/Urethra/Skene's glands:  Normal  Vagina:  Normal  Cervix:  Absent  Uterus:   Absent  Adnexa/parametria:     Rt: Without masses or tenderness.   Lt: Without masses or tenderness.  Anus and perineum: Normal  Digital rectal exam: Normal sphincter tone without palpated masses or tenderness  Patient informed chaperone available to be present for breast and pelvic exam. Patient has requested no chaperone to be present. Patient has been advised what will be completed during breast and pelvic exam.    Assessment/Plan:  45 y.o. G3 P3 for annual exam.   Well female exam with routine gynecological exam - Education provided on SBEs, importance of preventative screenings, current guidelines, high calcium diet, regular exercise, and multivitamin daily. Labs with PCP.  History of total vaginal hysterectomy (TVH) - 2012 for menorrhagia.   Hormone replacement therapy, postmenopausal - Plan: estradiol (VIVELLE-DOT) 0.05 MG/24HR patch. She has tried to wean in the past but could not tolerate. She still has occasional hot flashes. She is aware of the benefits versus risks to include benefits of symptom relief, bone health, and cardiovascular health. Risks including blood clots, heart attack, stroke, and breast cancer. She would like to continue. Refill x 1 year provided.   Screening for cervical cancer - Normal Pap history. No longer screening per guidelines.   Screening for breast cancer - Normal mammogram history.  Continue annual screenings.  Normal breast exam today.  Screening for colon cancer - Colonoscopy scheduled for later this year.  Screening for osteoporosis - will plan Dexa farther into menopause.   Follow up in 1 year for annual     [place], 8:40 AM 04/09/2021

## 2021-05-15 DIAGNOSIS — H524 Presbyopia: Secondary | ICD-10-CM | POA: Diagnosis not present

## 2021-05-29 ENCOUNTER — Other Ambulatory Visit (HOSPITAL_COMMUNITY): Payer: Self-pay

## 2021-08-21 ENCOUNTER — Encounter: Payer: Self-pay | Admitting: Gastroenterology

## 2021-08-28 ENCOUNTER — Other Ambulatory Visit (HOSPITAL_COMMUNITY): Payer: Self-pay

## 2021-08-28 ENCOUNTER — Other Ambulatory Visit: Payer: Self-pay

## 2021-08-28 ENCOUNTER — Ambulatory Visit (AMBULATORY_SURGERY_CENTER): Payer: 59

## 2021-08-28 VITALS — Ht 63.0 in | Wt 185.0 lb

## 2021-08-28 DIAGNOSIS — Z1211 Encounter for screening for malignant neoplasm of colon: Secondary | ICD-10-CM

## 2021-08-28 MED ORDER — NA SULFATE-K SULFATE-MG SULF 17.5-3.13-1.6 GM/177ML PO SOLN
1.0000 | Freq: Once | ORAL | 0 refills | Status: DC
  Filled 2021-08-28 – 2021-10-10 (×2): qty 354, 1d supply, fill #0

## 2021-08-28 NOTE — Progress Notes (Signed)
Pre visit completed via phone call; Patient verified name, DOB, and address; No egg or soy allergy known to patient  No issues known to pt with past sedation with any surgeries or procedures Patient denies ever being told they had issues or difficulty with intubation  No FH of Malignant Hyperthermia Pt is not on diet pills Pt is not on home 02  Pt is not on blood thinners  Pt reports issues with constipation -advised to increase po fluids/activity/ takes Colace/Miralax/Dulcolax;  No A fib or A flutter Pt is fully vaccinated for Covid x 2; NO PA's for preps discussed with pt in PV today  Discussed with pt there will be an out-of-pocket cost for prep and that varies from $0 to 70 +  dollars - pt verbalized understanding  Due to the COVID-19 pandemic we are asking patients to follow certain guidelines in PV and the [facility]   Pt aware of COVID protocols and [facility] guidelines;

## 2021-09-05 ENCOUNTER — Other Ambulatory Visit (HOSPITAL_COMMUNITY): Payer: Self-pay

## 2021-09-21 ENCOUNTER — Encounter: Payer: 59 | Admitting: Gastroenterology

## 2021-10-04 ENCOUNTER — Encounter: Payer: Self-pay | Admitting: Gastroenterology

## 2021-10-10 ENCOUNTER — Other Ambulatory Visit (HOSPITAL_COMMUNITY): Payer: Self-pay

## 2021-10-10 MED FILL — Ibuprofen Tab 800 MG: ORAL | 3 days supply | Qty: 30 | Fill #0 | Status: AC

## 2021-10-11 ENCOUNTER — Ambulatory Visit (AMBULATORY_SURGERY_CENTER): Payer: 59 | Admitting: Gastroenterology

## 2021-10-11 ENCOUNTER — Encounter: Payer: Self-pay | Admitting: Gastroenterology

## 2021-10-11 ENCOUNTER — Other Ambulatory Visit: Payer: Self-pay

## 2021-10-11 VITALS — BP 107/52 | HR 63 | Temp 98.1°F | Resp 15 | Ht 63.0 in | Wt 185.0 lb

## 2021-10-11 DIAGNOSIS — Z1211 Encounter for screening for malignant neoplasm of colon: Secondary | ICD-10-CM | POA: Diagnosis not present

## 2021-10-11 DIAGNOSIS — D120 Benign neoplasm of cecum: Secondary | ICD-10-CM

## 2021-10-11 DIAGNOSIS — D122 Benign neoplasm of ascending colon: Secondary | ICD-10-CM | POA: Diagnosis not present

## 2021-10-11 DIAGNOSIS — D124 Benign neoplasm of descending colon: Secondary | ICD-10-CM | POA: Diagnosis not present

## 2021-10-11 DIAGNOSIS — K635 Polyp of colon: Secondary | ICD-10-CM

## 2021-10-11 MED ORDER — SODIUM CHLORIDE 0.9 % IV SOLN: 500.0000 mL | Freq: Once | INTRAVENOUS | Status: DC

## 2021-10-11 NOTE — Progress Notes (Signed)
History and Physical:  This patient presents for endoscopic testing for: Encounter Diagnosis  Name Primary?   Colon cancer screening Yes    First screening exam Patient denies chronic abdominal pain, rectal bleeding, constipation or diarrhea.   ROS: Patient denies chest pain or cough   Past Medical History: Past Medical History:  Diagnosis Date   Chronic anemia IRON DEF.   Menorrhagia    Migraines    Normal delivery    x 3     Past Surgical History: Past Surgical History:  Procedure Laterality Date   ENDOMETRIAL ABLATION  05/17/2010   W/ HER OPTION ( IN MD OFFICE)   TUBAL LIGATION  2008   VAGINAL HYSTERECTOMY  08/15/2011   Procedure: HYSTERECTOMY VAGINAL;  Surgeon: Terrance Mass, MD;  Location: [facility];  Service: Gynecology;  Laterality: N/A;  TOTAL VAGINAL HYSTERECTOMY   WISDOM TOOTH EXTRACTION  2000    Allergies: Allergies  Allergen Reactions   Penicillins Rash   Percocet [Oxycodone-Acetaminophen] Itching    SEVERE ITCHING   Ciprofloxacin Rash    Outpatient Meds: Current Outpatient Medications  Medication Sig Dispense Refill   traZODone (DESYREL) 50 MG tablet Take 1 tablet (50 mg total) by mouth at bedtime as needed. 30 tablet 6   estradiol (VIVELLE-DOT) 0.05 MG/24HR patch Place 1 patch (0.05 mg total) onto the skin 2 (two) times a week. 24 patch 4   ibuprofen (ADVIL) 800 MG tablet TAKE 1 TABLET (800 MG TOTAL) BY MOUTH EVERY 8 (EIGHT) HOURS AS NEEDED. 30 tablet 1   phentermine (ADIPEX-P) 37.5 MG tablet Take 1 tablet (37.5 mg total) by mouth daily. (Patient not taking: Reported on 08/28/2021) 60 tablet 0   Current Facility-Administered Medications  Medication Dose Route Frequency Provider Last Rate Last Admin   0.9 %  sodium chloride infusion  500 mL Intravenous Once Doran Stabler, MD          ___________________________________________________________________ Objective   Exam:  BP 113/77    Pulse 71    Temp 98.1 F (36.7  C)    Ht 5\' 3"  (1.6 m)    Wt 185 lb (83.9 kg)    LMP 07/18/2011    SpO2 99%    BMI 32.77 kg/m   CV: RRR without murmur, S1/S2 Resp: clear to auscultation bilaterally, normal RR and effort noted GI: soft, no tenderness, with active bowel sounds.   Assessment: Encounter Diagnosis  Name Primary?   Colon cancer screening Yes     Plan: Colonoscopy  The benefits and risks of the planned procedure were described in detail with the patient or (when appropriate) their health care proxy.  Risks were outlined as including, but not limited to, bleeding, infection, perforation, adverse medication reaction leading to cardiac or pulmonary decompensation, pancreatitis (if ERCP).  The limitation of incomplete mucosal visualization was also discussed.  No guarantees or warranties were given.    The patient is appropriate for an endoscopic procedure in the ambulatory setting.   - Wilfrid Lund, MD

## 2021-10-11 NOTE — Progress Notes (Signed)
Called to room to assist during endoscopic procedure.  Patient ID and intended procedure confirmed with present staff. Received instructions for my participation in the procedure from the performing physician.  

## 2021-10-11 NOTE — Progress Notes (Signed)
Pt non-responsive, VVS, Report to RN  °

## 2021-10-11 NOTE — Progress Notes (Signed)
VS-CW  Pt's states no medical or surgical changes since previsit or office visit.  

## 2021-10-11 NOTE — Patient Instructions (Signed)
HANDOUTS PROVIDED ON: POLYPS & HEMORRHOIDS  The polyps removed today have been sent for pathology.  The results can take 1-3 weeks to receive.  When your next colonoscopy should occur will be based on the pathology results.    You may resume your previous diet and medication schedule.  Thank you for allowing [place] to care for you today!!!   YOU HAD AN ENDOSCOPIC PROCEDURE TODAY AT [facility]:   Refer to the procedure report that was given to you for any specific questions about what was found during the examination.  If the procedure report does not answer your questions, please call your gastroenterologist to clarify.  If you requested that your care partner not be given the details of your procedure findings, then the procedure report has been included in a sealed envelope for you to review at your convenience later.  YOU SHOULD EXPECT: Some feelings of bloating in the abdomen. Passage of more gas than usual.  Walking can help get rid of the air that was put into your GI tract during the procedure and reduce the bloating. If you had a lower endoscopy (such as a colonoscopy or flexible sigmoidoscopy) you may notice spotting of blood in your stool or on the toilet paper. If you underwent a bowel prep for your procedure, you may not have a normal bowel movement for a few days.  Please Note:  You might notice some irritation and congestion in your nose or some drainage.  This is from the oxygen used during your procedure.  There is no need for concern and it should clear up in a day or so.  SYMPTOMS TO REPORT IMMEDIATELY:  Following lower endoscopy (colonoscopy or flexible sigmoidoscopy):  Excessive amounts of blood in the stool  Significant tenderness or worsening of abdominal pains  Swelling of the abdomen that is new, acute  Fever of 100F or higher  For urgent or emergent issues, a gastroenterologist can be reached at any hour by calling [phone]. Do not use MyChart  messaging for urgent concerns.    DIET:  We do recommend a small meal at first, but then you may proceed to your regular diet.  Drink plenty of fluids but you should avoid alcoholic beverages for 24 hours.  ACTIVITY:  You should plan to take it easy for the rest of today and you should NOT DRIVE or use heavy machinery until tomorrow (because of the sedation medicines used during the test).    FOLLOW UP: Our staff will call the number listed on your records Monday morning between 7:15 am and 8:15 am following your procedure to check on you and address any questions or concerns that you may have regarding the information given to you following your procedure. If we do not reach you, we will leave a message.  We will attempt to reach you two times.  During this call, we will ask if you have developed any symptoms of COVID 19. If you develop any symptoms (ie: fever, flu-like symptoms, shortness of breath, cough etc.) before then, please call [phone].  If you test positive for Covid 19 in the 2 weeks post procedure, please call and report this information to [place].    If any biopsies were taken you will be contacted by phone or by letter within the next 1-3 weeks.  Please call us at [phone] if you have not heard about the biopsies in 3 weeks.    SIGNATURES/CONFIDENTIALITY: You and/or your care partner have  signed paperwork which will be entered into your electronic medical record.  These signatures attest to the fact that that the information above on your After Visit Summary has been reviewed and is understood.  Full responsibility of the confidentiality of this discharge information lies with you and/or your care-partner.

## 2021-10-11 NOTE — Op Note (Signed)
[facility] Patient Name: Latoya Murphy Procedure Date: 10/11/2021 2:08 PM MRN: [id number] Endoscopist: Mallie Mussel L. Loletha Carrow , MD Age: 46 Referring MD:  Date of Birth: Nov 04, 1975 Gender: Female Account #: [id number] Procedure:                Colonoscopy Indications:              Screening for colorectal malignant neoplasm, This                            is the patient's first colonoscopy Medicines:                Monitored Anesthesia Care Procedure:                Pre-Anesthesia Assessment:                           - Prior to the procedure, a History and Physical                            was performed, and patient medications and                            allergies were reviewed. The patient's tolerance of                            previous anesthesia was also reviewed. The risks                            and benefits of the procedure and the sedation                            options and risks were discussed with the patient.                            All questions were answered, and informed consent                            was obtained. Prior Anticoagulants: The patient has                            taken no previous anticoagulant or antiplatelet                            agents. ASA Grade Assessment: II - A patient with                            mild systemic disease. After reviewing the risks                            and benefits, the patient was deemed in                            satisfactory condition to undergo the procedure.  After obtaining informed consent, the colonoscope                            was passed under direct vision. Throughout the                            procedure, the patient's blood pressure, pulse, and                            oxygen saturations were monitored continuously. The                            CF [id number] #[id number] was introduced through the anus                            and advanced to the  the cecum, identified by                            appendiceal orifice and ileocecal valve. The                            colonoscopy was performed with difficulty due to a                            redundant colon and significant looping and                            initially fair prep. Successful completion of the                            procedure was aided by using manual pressure,                            straightening and shortening the scope to obtain                            bowel loop reduction and lavage. The patient                            tolerated the procedure well. The quality of the                            bowel preparation was initially fair, then improved                            to good with lavage. The ileocecal valve,                            appendiceal orifice, and rectum were photographed.                            The bowel preparation used was SUPREP. Scope In: 2:17:35 PM Scope Out: 2:41:06 PM Scope Withdrawal Time: 0 hours 15 minutes 48  seconds  Total Procedure Duration: 0 hours 23 minutes 31 seconds  Findings:                 The perianal and digital rectal examinations were                            normal.                           Repeat examination of right colon under NBI                            performed.                           Melanosis was found in the entire colon.                           Two semi-sessile polyps were found in the ascending                            colon and cecum. The polyps were diminutive in                            size. These polyps were removed with a cold snare.                            Resection and retrieval were complete. (Jar 1)                           A diminutive polyp was found in the descending                            colon. The polyp was semi-sessile. The polyp was                            removed with a cold snare. Resection and retrieval                            were  complete. (Jar 2)                           The colon (entire examined portion) was redundant.                           Internal hemorrhoids were found. The hemorrhoids                            were small.                           The exam was otherwise without abnormality on                            direct and retroflexion views. Complications:  No immediate complications. Estimated Blood Loss:     Estimated blood loss was minimal. Impression:               - Melanosis in the colon.                           - Two diminutive polyps in the ascending colon and                            in the cecum, removed with a cold snare. Resected                            and retrieved.                           - One diminutive polyp in the descending colon,                            removed with a cold snare. Resected and retrieved.                           - Redundant colon.                           - Internal hemorrhoids.                           - The examination was otherwise normal on direct                            and retroflexion views. Recommendation:           - Patient has a contact number available for                            emergencies. The signs and symptoms of potential                            delayed complications were discussed with the                            patient. Return to normal activities tomorrow.                            Written discharge instructions were provided to the                            patient.                           - Resume previous diet.                           - Continue present medications.                           - Await pathology results.                           -  Repeat colonoscopy is recommended for                            surveillance. The colonoscopy date will be                            determined after pathology results from today's                            exam become available for review. For  next exam,                            ducolax 20 mg before evening prep dose and consume                            more water with prep (redundant colon and chronic                            constipation) Mallie Mussel L. Loletha Carrow, MD 10/11/2021 2:50:51 PM This report has been signed electronically.

## 2021-10-15 ENCOUNTER — Telehealth: Payer: Self-pay | Admitting: *Deleted

## 2021-10-15 ENCOUNTER — Telehealth: Payer: Self-pay

## 2021-10-15 NOTE — Telephone Encounter (Signed)
First post procedure follow up call, no answer 

## 2021-10-15 NOTE — Telephone Encounter (Signed)
°  Follow up Call-  Call back number 10/11/2021  Post procedure Call Back phone  # [phone]  Permission to leave phone message Yes  Some recent data might be hidden     Patient questions:  Do you have a fever, pain , or abdominal swelling? No. Pain Score  0 *  Have you tolerated food without any problems? Yes.    Have you been able to return to your normal activities? Yes.    Do you have any questions about your discharge instructions: Diet   No. Medications  No. Follow up visit  No.  Do you have questions or concerns about your Care? No.  Actions: * If pain score is 4 or above: No action needed, pain <4.

## 2021-10-17 ENCOUNTER — Encounter: Payer: Self-pay | Admitting: Gastroenterology

## 2021-10-19 ENCOUNTER — Other Ambulatory Visit (HOSPITAL_COMMUNITY): Payer: Self-pay

## 2021-10-22 ENCOUNTER — Encounter (HOSPITAL_COMMUNITY): Payer: Self-pay | Admitting: *Deleted

## 2021-10-22 ENCOUNTER — Other Ambulatory Visit: Payer: Self-pay

## 2021-10-22 ENCOUNTER — Ambulatory Visit (HOSPITAL_COMMUNITY)
Admission: EM | Admit: 2021-10-22 | Discharge: 2021-10-22 | Disposition: A | Discharge status: Home / Self Care | Payer: 59 | Attending: Internal Medicine | Admitting: Internal Medicine

## 2021-10-22 ENCOUNTER — Other Ambulatory Visit (HOSPITAL_COMMUNITY): Payer: Self-pay

## 2021-10-22 ENCOUNTER — Ambulatory Visit (INDEPENDENT_AMBULATORY_CARE_PROVIDER_SITE_OTHER): Payer: 59

## 2021-10-22 DIAGNOSIS — M79672 Pain in left foot: Secondary | ICD-10-CM

## 2021-10-22 MED ORDER — NAPROXEN 500 MG PO TABS
500.0000 mg | ORAL_TABLET | Freq: Two times a day (BID) | ORAL | 0 refills | Status: DC
  Filled 2021-10-22: qty 30, 15d supply, fill #0

## 2021-10-22 NOTE — Discharge Instructions (Signed)
Your x-ray was normal.  Please call to schedule an appointment with podiatry soon as possible.  Use postop shoe for comfort.  Avoid prolonged walking and try to keep your foot elevated.  Take Naprosyn for pain and inflammation.  You should not take additional NSAIDs including aspirin, ibuprofen/Advil, naproxen/Aleve with this medication as it can cause stomach bleeding.  You can use Tylenol for breakthrough pain.  If you have any sudden worsening of symptoms including increased pain, fever, redness of the foot, swelling, develop a wound you should be seen immediately.

## 2021-10-22 NOTE — ED Provider Notes (Signed)
[facility]    CSN: [id number] Arrival date & time: 10/22/21  3846      History   Chief Complaint Chief Complaint  Patient presents with   Foot Pain    HPI Latoya Murphy is a 46 y.o. female.   Patient presents today with a several month history of left foot pain.  She reports that symptoms began after she stepped on a nail she is developed a painful callus in this area.  She was not evaluated following this injury.  She does have a history of previous fifth metatarsal fracture and has had chronic pain over the dorsal part of her left foot since that time; this happened approximately 1 year ago and did not require surgery.  She has been using a lidocaine patch with temporary improvement of symptoms.  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  She denies any numbness or paresthesias.  She has not seen a podiatrist.   Past Medical History:  Diagnosis Date   Chronic anemia IRON DEF.   Menorrhagia    Migraines    Normal delivery    x 3    Patient Active Problem List   Diagnosis Date Noted   History of total vaginal hysterectomy (TVH) 04/09/2021   Groin pain 03/14/2015   Dizzy spells 01/12/2015   Family history of diabetes mellitus 02/07/2012   Weight gain 02/07/2012   Anemia 08/14/2011    Past Surgical History:  Procedure Laterality Date   ENDOMETRIAL ABLATION  05/17/2010   W/ HER OPTION ( IN MD OFFICE)   TUBAL LIGATION  2008   VAGINAL HYSTERECTOMY  08/15/2011   Procedure: HYSTERECTOMY VAGINAL;  Surgeon: Terrance Mass, MD;  Location: [facility];  Service: Gynecology;  Laterality: N/A;  TOTAL VAGINAL HYSTERECTOMY   WISDOM TOOTH EXTRACTION  2000    OB History     Gravida  3   Para  3   Term  3   Preterm      AB      Living  3      SAB      IAB       Ectopic      Multiple      Live Births  3            Home Medications    Prior to Admission medications   Medication Sig Start Date End Date Taking? Authorizing Provider  naproxen (NAPROSYN) 500 MG tablet Take 1 tablet (500 mg total) by mouth 2 (two) times daily. 10/22/21  Yes Johnye Kist K, PA-C  estradiol (VIVELLE-DOT) 0.05 MG/24HR patch Place 1 patch (0.05 mg total) onto the skin 2 (two) times a week. 04/09/21   [name], NP  phentermine (ADIPEX-P) 37.5 MG tablet Take 1 tablet (37.5 mg total) by mouth daily. Patient not taking: Reported on 08/28/2021 02/02/21     traZODone (DESYREL) 50 MG tablet Take 1 tablet (50 mg total) by mouth at bedtime as needed. 12/26/20       Family History Family History  Problem Relation Age of Onset   Colon polyps Mother 23   Heart disease Father    Hypertension Father    Hypertension Brother    Diabetes Maternal Grandmother    Breast cancer Neg Hx  Colon cancer Neg Hx    Esophageal cancer Neg Hx    Rectal cancer Neg Hx    Stomach cancer Neg Hx     Social History Social History   Tobacco Use   Smoking status: Former    Types: Cigarettes    Quit date: 08/12/2001    Years since quitting: 20.2   Smokeless tobacco: Never  Vaping Use   Vaping Use: Never used  Substance Use Topics   Alcohol use: Not Currently    Comment: occ   Drug use: No     Allergies   Penicillins, Percocet [oxycodone-acetaminophen], and Ciprofloxacin   Review of Systems Review of Systems  Constitutional:  Positive for activity change. Negative for appetite change, fatigue and fever.  Musculoskeletal:  Positive for arthralgias, gait problem and joint swelling. Negative for myalgias.  Skin:  Negative for color change and wound.  Neurological:  Negative for weakness and numbness.    Physical Exam Triage Vital Signs ED Triage Vitals  Enc Vitals Group     BP 10/22/21 0949 113/64     Pulse Rate 10/22/21 0949 64     Resp 10/22/21 0949 18      Temp 10/22/21 0949 98.2 F (36.8 C)     Temp src --      SpO2 10/22/21 0949 99 %     Weight --      Height --      Head Circumference --      Peak Flow --      Pain Score 10/22/21 0947 8     Pain Loc --      Pain Edu? --      Excl. in [place]? --    No data found.  Updated Vital Signs BP 113/64    Pulse 64    Temp 98.2 F (36.8 C)    Resp 18    LMP 07/18/2011    SpO2 99%   Visual Acuity Right Eye Distance:   Left Eye Distance:   Bilateral Distance:    Right Eye Near:   Left Eye Near:    Bilateral Near:     Physical Exam Vitals reviewed.  Constitutional:      General: She is awake. She is not in acute distress.    Appearance: Normal appearance. She is well-developed. She is not ill-appearing.     Comments: Very pleasant female appears in data no acute distress sitting comfortably in exam room  HENT:     Head: Normocephalic and atraumatic.  Cardiovascular:     Rate and Rhythm: Normal rate and regular rhythm.     Pulses:          Posterior tibial pulses are 2+ on the right side and 2+ on the left side.     Heart sounds: Normal heart sounds, S1 normal and S2 normal. No murmur heard.    Comments: Capillary refill within 2 seconds left toes Pulmonary:     Effort: Pulmonary effort is normal.     Breath sounds: Normal breath sounds. No wheezing, rhonchi or rales.  Musculoskeletal:     Right lower leg: No edema.     Left lower leg: No edema.     Left foot: Normal range of motion. Tenderness present. No swelling, laceration or bony tenderness.     Comments: Left foot: Normal active range of motion at ankle and phalanges.  Tenderness to palpation over lateral left foot as well as over 1 cm nodule at medial arch.  No wound,  bleeding, swelling, deformity noted.  Foot neurovascularly intact.  Psychiatric:        Behavior: Behavior is cooperative.     UC Treatments / Results  Labs (all labs ordered are listed, but only abnormal results are displayed) Labs Reviewed - No data  to display  EKG   Radiology DG Foot Complete Left  Result Date: 10/22/2021 CLINICAL DATA:  Left foot pain.  Stepped on a nail last September. EXAM: LEFT FOOT - COMPLETE 3+ VIEW COMPARISON:  None. FINDINGS: There is no evidence of fracture or dislocation. There is no evidence of arthropathy or other focal bone abnormality. Soft tissues are unremarkable. No radiopaque foreign body identified. IMPRESSION: Negative. Electronically Signed   By: Titus Dubin M.D.   On: 10/22/2021 10:18    Procedures Procedures (including critical care time)  Medications Ordered in UC Medications - No data to display  Initial Impression / Assessment and Plan / UC Course  I have reviewed the triage vital signs and the nursing notes.  Pertinent labs & imaging results that were available during my care of the patient were reviewed by me and considered in my medical decision making (see chart for details).     X-ray obtained was negative.  Discussed that she would likely benefit from seeing a podiatrist and was given contact information for a local provider with instruction to call to schedule an appointment soon as possible.  She was placed in a postop shoe for comfort and encouraged to avoid prolonged ambulation.  She was provided a work excuse note.  She was prescribed Naprosyn twice daily with instruction to take additional NSAIDs with this medication due to risk of GI bleeding.  Discussed that if she has any worsening symptoms including increased pain, fever, redness, swelling, wound she should be seen immediately.  Strict return precautions given to which she expressed understanding.  Final Clinical Impressions(s) / UC Diagnoses   Final diagnoses:  Left foot pain     Discharge Instructions      Your x-ray was normal.  Please call to schedule an appointment with podiatry soon as possible.  Use postop shoe for comfort.  Avoid prolonged walking and try to keep your foot elevated.  Take Naprosyn for pain  and inflammation.  You should not take additional NSAIDs including aspirin, ibuprofen/Advil, naproxen/Aleve with this medication as it can cause stomach bleeding.  You can use Tylenol for breakthrough pain.  If you have any sudden worsening of symptoms including increased pain, fever, redness of the foot, swelling, develop a wound you should be seen immediately.     ED Prescriptions     Medication Sig Dispense Auth. Provider   naproxen (NAPROSYN) 500 MG tablet Take 1 tablet (500 mg total) by mouth 2 (two) times daily. 30 tablet Naylin Burkle, Derry Skill, PA-C      PDMP not reviewed this encounter.   Terrilee Croak, PA-C 10/22/21 1032

## 2021-10-22 NOTE — ED Triage Notes (Signed)
Pt presents Pain in Lt foot. Pt reports having a hard time standing at work due to pain in Lt foot. Pt reports a old injury to foot and ankle. Pt requested an x-ray.

## 2021-10-26 ENCOUNTER — Other Ambulatory Visit (HOSPITAL_COMMUNITY): Payer: Self-pay

## 2021-10-26 MED ORDER — CARESTART COVID-19 HOME TEST VI KIT
PACK | 0 refills | Status: DC
  Filled 2021-10-26: qty 4, 4d supply, fill #0

## 2021-10-31 ENCOUNTER — Ambulatory Visit (INDEPENDENT_AMBULATORY_CARE_PROVIDER_SITE_OTHER): Payer: 59

## 2021-10-31 ENCOUNTER — Other Ambulatory Visit: Payer: Self-pay

## 2021-10-31 ENCOUNTER — Ambulatory Visit: Payer: 59 | Admitting: Podiatry

## 2021-10-31 ENCOUNTER — Encounter: Payer: Self-pay | Admitting: Podiatry

## 2021-10-31 DIAGNOSIS — M722 Plantar fascial fibromatosis: Secondary | ICD-10-CM

## 2021-10-31 DIAGNOSIS — M79672 Pain in left foot: Secondary | ICD-10-CM

## 2021-10-31 MED ORDER — TRIAMCINOLONE ACETONIDE 10 MG/ML IJ SUSP
10.0000 mg | Freq: Once | INTRAMUSCULAR | Status: AC
  Administered 2021-10-31: 10 mg

## 2021-10-31 NOTE — Progress Notes (Signed)
Subjective:   Patient ID: Latoya Murphy, female   DOB: 46 y.o.   MRN: [id number]   HPI Chronic pain of the left foot with history of injury and nail that did penetrate her left medial side which created lesion formation 6 months ago.  Pain has been present for 1 year   Review of Systems  All other systems reviewed and are negative.      Objective:  Physical Exam Vitals and nursing note reviewed.  Constitutional:      Appearance: She is well-developed.  Pulmonary:     Effort: Pulmonary effort is normal.  Musculoskeletal:        General: Normal range of motion.  Skin:    General: Skin is warm.  Neurological:     Mental Status: She is alert.    Patient presents stating that she has had unusual pain in her foot for at least a year duration and states it seems to be mostly on the outside of the foot but also can be on the inside of the foot and she has a lesion in the left arch which has been painful and she stated it occurred since she traumatized it with a nail and that this performed and that is what bothers her all the time.     Assessment:  Probability for trauma to the left foot which creates some form of an inclusion cyst or inflammatory type fasciitis within the mid arch along with difficult to identify pain in the remainder of the left foot     Plan:  H&P x-rays reviewed all conditions discussed and at this point I am focusing on the lesion and the inflammation of the mid arch with sterile prep and injected the mid arch area left 3 mg Kenalog 5 mg Xylocaine sterile sharp debridement of accomplished and I want to see the response and when she comes back next if she continues to have other pains I want her to try to flared up with activities that usually create problems for her  X-rays indicate that there is no signs of foreign body where she was penetrated by the nail and I did not note other pathology currently

## 2021-11-07 ENCOUNTER — Other Ambulatory Visit: Payer: Self-pay | Admitting: Podiatry

## 2021-11-07 ENCOUNTER — Other Ambulatory Visit (HOSPITAL_COMMUNITY): Payer: Self-pay

## 2021-11-07 DIAGNOSIS — M722 Plantar fascial fibromatosis: Secondary | ICD-10-CM

## 2021-11-07 MED ORDER — PHENTERMINE HCL 37.5 MG PO TABS
37.5000 mg | ORAL_TABLET | Freq: Every day | ORAL | 0 refills | Status: DC
  Filled 2021-11-07: qty 60, 60d supply, fill #0

## 2021-11-30 ENCOUNTER — Ambulatory Visit: Payer: 59 | Admitting: Podiatry

## 2021-12-07 ENCOUNTER — Other Ambulatory Visit (HOSPITAL_COMMUNITY): Payer: Self-pay

## 2021-12-27 ENCOUNTER — Ambulatory Visit: Payer: 59 | Admitting: Podiatry

## 2021-12-27 ENCOUNTER — Encounter: Payer: Self-pay | Admitting: Podiatry

## 2021-12-27 DIAGNOSIS — D367 Benign neoplasm of other specified sites: Secondary | ICD-10-CM | POA: Diagnosis not present

## 2021-12-28 ENCOUNTER — Telehealth: Payer: Self-pay | Admitting: Urology

## 2021-12-28 NOTE — Progress Notes (Signed)
Subjective:  ? ?Patient ID: Latoya Murphy, female   DOB: 46 y.o.   MRN: [id number]  ? ?HPI ?Patient presents stating that the lesion has not resolved did not respond to medication or trimming still very sore and hard to walk on after having a injury with a nail that entered this area last year ? ? ?ROS ? ? ?   ?Objective:  ?Physical Exam  ?Neurovascular status intact with what appears to be a benign neoplasm or some form of an inclusion cyst of the left arch that measures about 7 x 7 mm and very painful when pressed ? ?   ?Assessment:  ?Chronic lesion that did not respond to an inflammatory treatment and debridement ? ?   ?Plan:  ?Reviewed condition and the pain she is experiencing and I have recommended wide excision with suturing.  I did explain recovery and allowed her to read then signed consent form understanding risk and that I will send this to pathology.  I went ahead today and I also placed her in an air fracture walker for the procedure so that there is no weightbearing on it during the healing phase and hopefully will not perform any type of scar or thickening.  She understands that is a risk with the arch and wants surgery signed the consent form at this time and is scheduled for outpatient procedure ?   ? ? ?

## 2021-12-28 NOTE — Telephone Encounter (Signed)
DOS - 01/15/22 ? ?EXC BENIGN LESION LEFT --- 40102 ? ?[facility] EFFECTIVE DATE - 09/16/21 ? ?SPOKE WITH LINDA AND SHE STATED FOR CPT CODE 72536 NO PRIOR AUTH IS REQUIRED. ? ?REF # LINDA A. 12/28/21 AT 9:20AM  ?

## 2022-01-08 ENCOUNTER — Encounter: Payer: Self-pay | Admitting: Podiatry

## 2022-01-14 ENCOUNTER — Other Ambulatory Visit (HOSPITAL_COMMUNITY): Payer: Self-pay

## 2022-01-14 DIAGNOSIS — Z13220 Encounter for screening for lipoid disorders: Secondary | ICD-10-CM | POA: Diagnosis not present

## 2022-01-14 DIAGNOSIS — G4700 Insomnia, unspecified: Secondary | ICD-10-CM | POA: Diagnosis not present

## 2022-01-14 DIAGNOSIS — E663 Overweight: Secondary | ICD-10-CM | POA: Diagnosis not present

## 2022-01-14 DIAGNOSIS — Z0000 Encounter for general adult medical examination without abnormal findings: Secondary | ICD-10-CM | POA: Diagnosis not present

## 2022-01-14 HISTORY — PX: FOOT SURGERY: SHX648

## 2022-01-14 MED ORDER — HYDROCODONE-ACETAMINOPHEN 10-325 MG PO TABS
1.0000 | ORAL_TABLET | Freq: Three times a day (TID) | ORAL | 0 refills | Status: AC | PRN
  Filled 2022-01-14: qty 15, 5d supply, fill #0

## 2022-01-14 NOTE — Addendum Note (Signed)
Addended by: Wallene Huh on: 01/14/2022 01:43 PM ? ? Modules accepted: Orders ? ?

## 2022-01-15 DIAGNOSIS — L720 Epidermal cyst: Secondary | ICD-10-CM | POA: Diagnosis not present

## 2022-01-15 DIAGNOSIS — D492 Neoplasm of unspecified behavior of bone, soft tissue, and skin: Secondary | ICD-10-CM | POA: Diagnosis not present

## 2022-01-21 ENCOUNTER — Ambulatory Visit (INDEPENDENT_AMBULATORY_CARE_PROVIDER_SITE_OTHER): Payer: 59 | Admitting: Podiatry

## 2022-01-21 ENCOUNTER — Encounter: Payer: Self-pay | Admitting: Podiatry

## 2022-01-21 ENCOUNTER — Other Ambulatory Visit: Payer: Self-pay | Admitting: Internal Medicine

## 2022-01-21 DIAGNOSIS — Z1231 Encounter for screening mammogram for malignant neoplasm of breast: Secondary | ICD-10-CM

## 2022-01-21 DIAGNOSIS — D367 Benign neoplasm of other specified sites: Secondary | ICD-10-CM

## 2022-01-21 NOTE — Progress Notes (Signed)
Subjective:  ? ?Patient ID: Latoya Murphy, female   DOB: 46 y.o.   MRN: [id number]  ? ?HPI ?Patient presents stating doing well with my surgery very pleased so far ? ? ?ROS ? ? ?   ?Objective:  ?Physical Exam  ?Neurovascular status found to be intact with patient found to have well-healing surgical site left arch from excision of a soft tissue mass that I am still waiting on pathology.  Patient has negative Bevelyn Buckles' sign noted wound edges well coapted ? ?   ?Assessment:  ?Doing well post removal of benign lesion plantar aspect left arch ? ?   ?Plan:  ?H&P reviewed condition and recommended continued boot usage for 2 more weeks compression stitches to removed in 2 weeks and at that point she will not need to be seen again.  I do think this will be uneventful and healing and patient will be seen back earlier if needed ?   ? ? ?

## 2022-01-29 ENCOUNTER — Telehealth: Payer: Self-pay | Admitting: *Deleted

## 2022-01-29 NOTE — Telephone Encounter (Signed)
Patient is having clear drainage coming from a small area on her post surgical foot, no odor also she has started to have calf pain for 2 days, very concerned. Please advise. ?

## 2022-01-29 NOTE — Telephone Encounter (Signed)
Can you schedule for sooner appointment to be evaluated DVT?

## 2022-01-29 NOTE — Telephone Encounter (Signed)
Lvm for pt to call tomorrow morning to get a sooner appt. ?

## 2022-01-30 ENCOUNTER — Ambulatory Visit (INDEPENDENT_AMBULATORY_CARE_PROVIDER_SITE_OTHER): Payer: 59 | Admitting: Podiatry

## 2022-01-30 ENCOUNTER — Encounter: Payer: Self-pay | Admitting: Podiatry

## 2022-01-30 DIAGNOSIS — D367 Benign neoplasm of other specified sites: Secondary | ICD-10-CM | POA: Diagnosis not present

## 2022-01-30 NOTE — Progress Notes (Signed)
Subjective:  ? ?Patient ID: Latoya Murphy, female   DOB: 46 y.o.   MRN: [id number]  ? ?HPI ?Patient presents stating that it is getting dry around her incision and she needs her stitches removed and also had some cramping in the back of her calf that are gone but she wanted that checked ? ? ?ROS ? ? ?   ?Objective:  ?Physical Exam  ?Neurovascular status intact negative Bevelyn Buckles' sign was noted with no indications of blood clot it was probably just due to activity levels and boot usage.  Incision sites healing well wound edges well coapted stitches intact ? ?   ?Assessment:  ?Doing well post incision of the left first excision of a soft tissue mass ? ?   ?Plan:  ?Martin Majestic ahead today remove stitches wound edges remain coapted well no drainage discussed if any changes should occur we can get a Doppler done and I reviewed what to watch out for and certainly if any issues were to occur with breathing she is to go immediately to the emergency room.  She is discharged from this procedure will be seen back as needed ?   ? ? ?

## 2022-01-31 NOTE — Telephone Encounter (Signed)
Thanks

## 2022-02-06 ENCOUNTER — Encounter: Payer: 59 | Admitting: Podiatry

## 2022-02-07 ENCOUNTER — Encounter: Payer: 59 | Admitting: Podiatry

## 2022-02-25 ENCOUNTER — Ambulatory Visit: Payer: 59

## 2022-02-27 ENCOUNTER — Ambulatory Visit
Admission: RE | Admit: 2022-02-27 | Discharge: 2022-02-27 | Disposition: A | Discharge status: Home / Self Care | Payer: 59 | Source: Ambulatory Visit | Attending: Internal Medicine | Admitting: Internal Medicine

## 2022-02-27 DIAGNOSIS — Z1231 Encounter for screening mammogram for malignant neoplasm of breast: Secondary | ICD-10-CM

## 2022-04-03 ENCOUNTER — Other Ambulatory Visit (HOSPITAL_COMMUNITY): Payer: Self-pay

## 2022-04-10 ENCOUNTER — Ambulatory Visit: Payer: 59 | Admitting: Nurse Practitioner

## 2022-04-11 ENCOUNTER — Other Ambulatory Visit (HOSPITAL_COMMUNITY): Payer: Self-pay

## 2022-04-29 ENCOUNTER — Other Ambulatory Visit (HOSPITAL_COMMUNITY): Payer: Self-pay

## 2022-05-01 ENCOUNTER — Other Ambulatory Visit (HOSPITAL_COMMUNITY): Payer: Self-pay

## 2022-05-01 DIAGNOSIS — H524 Presbyopia: Secondary | ICD-10-CM | POA: Diagnosis not present

## 2022-05-01 MED ORDER — DIETHYLPROPION HCL ER 75 MG PO TB24
75.0000 mg | ORAL_TABLET | Freq: Every day | ORAL | 0 refills | Status: DC
  Filled 2022-05-01: qty 14, 14d supply, fill #0

## 2022-05-07 NOTE — Progress Notes (Unsigned)
   Latoya Murphy Feb 15, 1976 [id number]   History:  46 y.o. G3 P3 presents for annual exam without GYN complaints. 2012 TVH for menorrhagia, on ERT with good management of hot flashes. She will stop use and do fine for 2-3 months then hot flashes come back strong. Normal pap and mammogram history.   Gynecologic History Patient's last menstrual period was 07/18/2011.   Contraception: status post hysterectomy Sexually active: Yes  Health maintenance Last Pap: 04/05/2020 Results were: Normal Last mammogram: 02/27/2022. Results were: Normal Last colonoscopy: 10/11/2021. Results were: SSP, 3-year recall Last Dexa: Not indicated  Past medical history, past surgical history, family history and social history were all reviewed and documented in the EPIC chart. Married. 3 children. 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   ROS:  A ROS was performed and pertinent positives and negatives are included.  Exam:  Vitals:   05/08/22 0821  BP: 112/78  Pulse: 62  SpO2: 99%  Weight: 205 lb (93 kg)  Height: '5\' 3"'$  (1.6 m)     Body mass index is 36.31 kg/m.  General appearance:  Normal Thyroid:  Symmetrical, normal in size, without palpable masses or nodularity. Respiratory  Auscultation:  Clear without wheezing or rhonchi Cardiovascular  Auscultation:  Regular rate, without rubs, murmurs or gallops  Edema/varicosities:  Not grossly evident Abdominal  Soft,nontender, without masses, guarding or rebound.  Liver/spleen:  No organomegaly noted  Hernia:  None appreciated  Skin  Inspection:  Grossly normal   Breasts: Examined lying and sitting.   Right: Without masses, retractions, discharge or axillary adenopathy.   Left: Without masses, retractions, discharge or axillary adenopathy. Genitourinary   Inguinal/mons:  Normal without inguinal adenopathy  External genitalia:  Normal appearing vulva with no masses, tenderness, or  lesions  BUS/Urethra/Skene's glands:  Normal  Vagina:  Normal appearing with normal color and discharge, no lesions  Cervix:  and uterus absent  Adnexa/parametria:     Rt: Normal in size, without masses or tenderness.   Lt: Normal in size, without masses or tenderness.  Anus and perineum: Normal  Digital rectal exam: Normal sphincter tone without palpated masses or tenderness  Patient informed chaperone available to be present for breast and pelvic exam. Patient has requested no chaperone to be present. Patient has been advised what will be completed during breast and pelvic exam.   Assessment/Plan:  46 y.o. G3 P3 for annual exam.   Well female exam with routine gynecological exam - Education provided on SBEs, importance of preventative screenings, current guidelines, high calcium diet, regular exercise, and multivitamin daily. Labs with PCP.  Hormone replacement therapy, postmenopausal - Plan: estradiol (VIVELLE-DOT) 0.05 MG/24HR patch. She will stop use and do fine for 2-3 months then hot flashes come back strong. She is aware of the benefits versus risks to include benefits of symptom relief, bone health, and cardiovascular health. Risks including blood clots, heart attack, stroke, and breast cancer. She would like to continue. Refill x 1 year provided.   Screening for cervical cancer - Normal Pap history. No longer screening per guidelines.   Screening for breast cancer - Normal mammogram history.  Continue annual screenings.  Normal breast exam today.  Screening for colon cancer - 09/2021 colonoscopy. 3-year recall.   Screening for osteoporosis - Average risk. Will plan Dexa farther into menopause.   Follow up in 1 year for annual.    Latoya Murphy [facility], 8:38 AM 05/08/2022

## 2022-05-08 ENCOUNTER — Ambulatory Visit (INDEPENDENT_AMBULATORY_CARE_PROVIDER_SITE_OTHER): Payer: 59 | Admitting: Nurse Practitioner

## 2022-05-08 ENCOUNTER — Encounter: Payer: Self-pay | Admitting: Nurse Practitioner

## 2022-05-08 ENCOUNTER — Other Ambulatory Visit (HOSPITAL_COMMUNITY): Payer: Self-pay

## 2022-05-08 VITALS — BP 112/78 | HR 62 | Ht 63.0 in | Wt 205.0 lb

## 2022-05-08 DIAGNOSIS — Z01419 Encounter for gynecological examination (general) (routine) without abnormal findings: Secondary | ICD-10-CM | POA: Diagnosis not present

## 2022-05-08 DIAGNOSIS — Z79890 Hormone replacement therapy (postmenopausal): Secondary | ICD-10-CM

## 2022-05-08 MED ORDER — ESTRADIOL 0.05 MG/24HR TD PTTW
1.0000 | MEDICATED_PATCH | TRANSDERMAL | 3 refills | Status: DC
  Filled 2022-05-08: qty 24, 84d supply, fill #0
  Filled 2023-01-14: qty 24, 84d supply, fill #1

## 2022-06-06 DIAGNOSIS — K439 Ventral hernia without obstruction or gangrene: Secondary | ICD-10-CM | POA: Diagnosis not present

## 2022-07-03 ENCOUNTER — Other Ambulatory Visit (HOSPITAL_COMMUNITY): Payer: Self-pay

## 2022-07-23 ENCOUNTER — Other Ambulatory Visit (HOSPITAL_COMMUNITY): Payer: Self-pay

## 2023-01-14 ENCOUNTER — Other Ambulatory Visit (HOSPITAL_COMMUNITY): Payer: Self-pay

## 2023-01-14 ENCOUNTER — Other Ambulatory Visit: Payer: Self-pay

## 2023-01-14 MED ORDER — TRAZODONE HCL 50 MG PO TABS
50.0000 mg | ORAL_TABLET | Freq: Every evening | ORAL | 1 refills | Status: DC | PRN
  Filled 2023-01-14: qty 30, 30d supply, fill #0
  Filled 2023-08-07: qty 30, 30d supply, fill #1

## 2023-01-15 ENCOUNTER — Other Ambulatory Visit: Payer: Self-pay

## 2023-03-31 ENCOUNTER — Other Ambulatory Visit: Payer: Self-pay | Admitting: Internal Medicine

## 2023-03-31 DIAGNOSIS — Z1231 Encounter for screening mammogram for malignant neoplasm of breast: Secondary | ICD-10-CM

## 2023-04-01 ENCOUNTER — Ambulatory Visit
Admission: RE | Admit: 2023-04-01 | Discharge: 2023-04-01 | Disposition: A | Discharge status: Home / Self Care | Payer: Commercial Managed Care - PPO | Source: Ambulatory Visit | Attending: Internal Medicine | Admitting: Internal Medicine

## 2023-04-01 DIAGNOSIS — Z1231 Encounter for screening mammogram for malignant neoplasm of breast: Secondary | ICD-10-CM | POA: Diagnosis not present

## 2023-04-17 ENCOUNTER — Ambulatory Visit: Payer: Commercial Managed Care - PPO | Admitting: Nurse Practitioner

## 2023-05-12 ENCOUNTER — Other Ambulatory Visit (HOSPITAL_COMMUNITY): Payer: Self-pay

## 2023-05-12 ENCOUNTER — Encounter: Payer: Self-pay | Admitting: Nurse Practitioner

## 2023-05-12 ENCOUNTER — Ambulatory Visit (INDEPENDENT_AMBULATORY_CARE_PROVIDER_SITE_OTHER): Payer: Commercial Managed Care - PPO | Admitting: Nurse Practitioner

## 2023-05-12 VITALS — BP 118/78 | HR 62 | Ht 63.0 in | Wt 189.0 lb

## 2023-05-12 DIAGNOSIS — E785 Hyperlipidemia, unspecified: Secondary | ICD-10-CM

## 2023-05-12 DIAGNOSIS — Z01419 Encounter for gynecological examination (general) (routine) without abnormal findings: Secondary | ICD-10-CM | POA: Diagnosis not present

## 2023-05-12 DIAGNOSIS — Z79890 Hormone replacement therapy (postmenopausal): Secondary | ICD-10-CM

## 2023-05-12 MED ORDER — ESTRADIOL 0.05 MG/24HR TD PTTW
1.0000 | MEDICATED_PATCH | TRANSDERMAL | 3 refills | Status: DC
  Filled 2023-05-12: qty 24, 84d supply, fill #0
  Filled 2023-08-07: qty 24, 84d supply, fill #1

## 2023-05-12 NOTE — Progress Notes (Signed)
Latoya Murphy 1976/06/01 [id number]   History:  47 y.o. G3 P3 presents for annual exam without GYN complaints. S/P 2012 TVH for menorrhagia, on ERT with good management of hot flashes. Has tried to wean but does not tolerate. Recently had increase in intensity of hot flashes but they are tolerable now. Normal pap and mammogram history. Requesting preventative labs today.   Gynecologic History Patient's last menstrual period was 07/18/2011.   Contraception: status post hysterectomy Sexually active: Yes  Health maintenance Last Pap: 04/05/2020. Results were: Normal neg HPV Last mammogram: 04/01/2023. Results were: Normal Last colonoscopy: 10/11/2021. Results were: SSP, 3-year recall Last Dexa: Not indicated  Past medical history, past surgical history, family history and social history were all reviewed and documented in the EPIC chart. Married. 3 children ages 15-21. 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   ROS:  A ROS was performed and pertinent positives and negatives are included.  Exam:  Vitals:   05/12/23 0823  BP: 118/78  Pulse: 62  SpO2: 100%  Weight: 189 lb (85.7 kg)  Height: 5\' 3"  (1.6 m)      Body mass index is 33.48 kg/m.  General appearance:  Normal Thyroid:  Symmetrical, normal in size, without palpable masses or nodularity. Respiratory  Auscultation:  Clear without wheezing or rhonchi Cardiovascular  Auscultation:  Regular rate, without rubs, murmurs or gallops  Edema/varicosities:  Not grossly evident Abdominal  Soft,nontender, without masses, guarding or rebound.  Liver/spleen:  No organomegaly noted  Hernia:  None appreciated  Skin  Inspection:  Grossly normal   Breasts: Examined lying and sitting.   Right: Without masses, retractions, discharge or axillary adenopathy.   Left: Without masses, retractions, discharge or axillary adenopathy. Genitourinary   Inguinal/mons:  Normal without  inguinal adenopathy  External genitalia:  Normal appearing vulva with no masses, tenderness, or lesions  BUS/Urethra/Skene's glands:  Normal  Vagina:  Normal appearing with normal color and discharge, no lesions  Cervix:  and uterus absent  Adnexa/parametria:     Rt: Normal in size, without masses or tenderness.   Lt: Normal in size, without masses or tenderness.  Anus and perineum: Normal  Digital rectal exam: Not indicated  Patient informed chaperone available to be present for breast and pelvic exam. Patient has requested no chaperone to be present. Patient has been advised what will be completed during breast and pelvic exam.   Assessment/Plan:  47 y.o. G3 P3 for annual exam.   Well female exam with routine gynecological exam - Plan: CBC with Differential/Platelet, Comprehensive metabolic panel. Education provided on SBEs, importance of preventative screenings, current guidelines, high calcium diet, regular exercise, and multivitamin daily.   Postmenopausal hormone therapy - Plan: estradiol (VIVELLE-DOT) 0.05 MG/24HR patch twice weekly. Has not tolerated weaning in the past. Recently had increase in intensity of hot flashes but they are tolerable now. She is aware of the benefits and risks to include benefits of symptom relief, bone health, and cardiovascular health. Risks including blood clots, heart attack, stroke, and breast cancer. She would like to continue. Refill x 1 year provided.   Hyperlipidemia, unspecified hyperlipidemia type - Plan: Lipid panel  Screening for cervical cancer - Normal Pap history. No longer screening per guidelines.   Screening for breast cancer - Normal mammogram history.  Continue annual screenings.  Normal breast exam today.  Screening for colon cancer - 09/2021 colonoscopy. 3-year recall.   Screening for osteoporosis - Average risk. Will plan DXA further  into menopause.   Follow up in 1 year for annual.    Olivia Mackie [facility], 8:35 AM  05/12/2023

## 2023-05-13 LAB — CBC WITH DIFFERENTIAL/PLATELET
Absolute Monocytes: 454 {cells}/uL (ref 200–950)
Basophils Absolute: 50 {cells}/uL (ref 0–200)
Basophils Relative: 0.8 %
Eosinophils Absolute: 88 {cells}/uL (ref 15–500)
Eosinophils Relative: 1.4 %
HCT: 36.1 % (ref 35.0–45.0)
Hemoglobin: 11.6 g/dL — ABNORMAL LOW (ref 11.7–15.5)
Lymphs Abs: 2155 {cells}/uL (ref 850–3900)
MCH: 26.4 pg — ABNORMAL LOW (ref 27.0–33.0)
MCHC: 32.1 g/dL (ref 32.0–36.0)
MCV: 82.2 fL (ref 80.0–100.0)
MPV: 10.9 fL (ref 7.5–12.5)
Monocytes Relative: 7.2 %
Neutro Abs: 3553 {cells}/uL (ref 1500–7800)
Neutrophils Relative %: 56.4 %
Platelets: 252 10*3/uL (ref 140–400)
RBC: 4.39 10*6/uL (ref 3.80–5.10)
RDW: 13.1 % (ref 11.0–15.0)
Total Lymphocyte: 34.2 %
WBC: 6.3 10*3/uL (ref 3.8–10.8)

## 2023-05-13 LAB — LIPID PANEL
Cholesterol: 186 mg/dL (ref <200)
HDL: 46 mg/dL — ABNORMAL LOW (ref >=50)
LDL Cholesterol (Calc): 124 mg/dL — ABNORMAL HIGH
Non-HDL Cholesterol (Calc): 140 mg/dL — ABNORMAL HIGH (ref <130)
Total CHOL/HDL Ratio: 4 (calc) (ref <5.0)
Triglycerides: 67 mg/dL (ref <150)

## 2023-05-13 LAB — COMPREHENSIVE METABOLIC PANEL
AG Ratio: 1.5 (calc) (ref 1.0–2.5)
ALT: 10 U/L (ref 6–29)
AST: 19 U/L (ref 10–35)
Albumin: 4.4 g/dL (ref 3.6–5.1)
Alkaline phosphatase (APISO): 58 U/L (ref 31–125)
BUN/Creatinine Ratio: 14 (calc) (ref 6–22)
BUN: 15 mg/dL (ref 7–25)
CO2: 21 mmol/L (ref 20–32)
Calcium: 9.8 mg/dL (ref 8.6–10.2)
Chloride: 107 mmol/L (ref 98–110)
Creat: 1.07 mg/dL — ABNORMAL HIGH (ref 0.50–0.99)
Globulin: 2.9 g/dL (ref 1.9–3.7)
Glucose, Bld: 89 mg/dL (ref 65–99)
Potassium: 4 mmol/L (ref 3.5–5.3)
Sodium: 137 mmol/L (ref 135–146)
Total Bilirubin: 0.5 mg/dL (ref 0.2–1.2)
Total Protein: 7.3 g/dL (ref 6.1–8.1)

## 2023-05-16 ENCOUNTER — Other Ambulatory Visit (HOSPITAL_COMMUNITY): Payer: Self-pay

## 2023-05-22 DIAGNOSIS — H524 Presbyopia: Secondary | ICD-10-CM | POA: Diagnosis not present

## 2023-05-30 ENCOUNTER — Other Ambulatory Visit (HOSPITAL_COMMUNITY): Payer: Self-pay

## 2023-05-30 MED ORDER — CLINDAMYCIN HCL 300 MG PO CAPS
300.0000 mg | ORAL_CAPSULE | Freq: Three times a day (TID) | ORAL | 0 refills | Status: DC
  Filled 2023-05-30: qty 30, 10d supply, fill #0

## 2023-05-30 MED ORDER — PROMETHAZINE HCL 12.5 MG PO TABS
12.5000 mg | ORAL_TABLET | ORAL | 0 refills | Status: DC | PRN
  Filled 2023-05-30: qty 30, 5d supply, fill #0

## 2023-05-30 MED ORDER — HYDROCODONE-ACETAMINOPHEN 5-325 MG PO TABS
1.0000 | ORAL_TABLET | ORAL | 0 refills | Status: DC | PRN
  Filled 2023-05-30: qty 30, 3d supply, fill #0

## 2023-05-30 MED ORDER — IBUPROFEN 800 MG PO TABS
800.0000 mg | ORAL_TABLET | ORAL | 0 refills | Status: DC
  Filled 2023-05-30: qty 30, 15d supply, fill #0

## 2023-06-12 HISTORY — PX: OTHER SURGICAL HISTORY: SHX169

## 2023-12-25 ENCOUNTER — Other Ambulatory Visit (HOSPITAL_COMMUNITY): Payer: Self-pay

## 2023-12-26 ENCOUNTER — Other Ambulatory Visit (HOSPITAL_COMMUNITY): Payer: Self-pay

## 2023-12-26 ENCOUNTER — Other Ambulatory Visit: Payer: Self-pay

## 2023-12-26 MED ORDER — TRAZODONE HCL 50 MG PO TABS
50.0000 mg | ORAL_TABLET | Freq: Every evening | ORAL | 3 refills | Status: AC | PRN
  Filled 2023-12-26: qty 30, 30d supply, fill #0
  Filled 2024-04-01: qty 30, 30d supply, fill #1

## 2024-01-22 ENCOUNTER — Other Ambulatory Visit: Payer: Self-pay | Admitting: Internal Medicine

## 2024-01-22 DIAGNOSIS — Z1231 Encounter for screening mammogram for malignant neoplasm of breast: Secondary | ICD-10-CM

## 2024-02-15 ENCOUNTER — Encounter: Payer: Self-pay | Admitting: *Deleted

## 2024-02-15 NOTE — Congregational Nurse Program (Signed)
  Dept: [phone]   Congregational Nurse Program Note  Date of Encounter: 02/07/24  Past Medical History: Past Medical History:  Diagnosis Date   Chronic anemia IRON DEF.   Menorrhagia    Migraines    Normal delivery    x 3    Encounter Details:  Community Questionnaire - 02/15/24 1201       Questionnaire   Ask client: Do you give verbal consent for me to treat you today? Yes    Student Assistance N/A    Location Patient Served  [employer]    Encounter Setting CN site    Population Status Unknown    Insurance Unknown    Insurance/Financial Assistance Referral N/A    Medication N/A    Screening Referrals Made N/A    Medical Referrals Made N/A    Medical Appointment Completed N/A    CNP Interventions Educate    Screenings CN Performed Blood Pressure    ED Visit Averted N/A    Life-Saving Intervention Made N/A            Attended a community event on diabetes. Adileny Delon M

## 2024-04-01 ENCOUNTER — Other Ambulatory Visit (HOSPITAL_COMMUNITY): Payer: Self-pay

## 2024-04-01 ENCOUNTER — Ambulatory Visit
Admission: RE | Admit: 2024-04-01 | Discharge: 2024-04-01 | Disposition: A | Discharge status: Home / Self Care | Source: Ambulatory Visit | Attending: Internal Medicine | Admitting: Internal Medicine

## 2024-04-01 DIAGNOSIS — Z1231 Encounter for screening mammogram for malignant neoplasm of breast: Secondary | ICD-10-CM

## 2024-05-04 DIAGNOSIS — Z23 Encounter for immunization: Secondary | ICD-10-CM | POA: Diagnosis not present

## 2024-05-04 DIAGNOSIS — E663 Overweight: Secondary | ICD-10-CM | POA: Diagnosis not present

## 2024-05-04 DIAGNOSIS — G4700 Insomnia, unspecified: Secondary | ICD-10-CM | POA: Diagnosis not present

## 2024-05-04 DIAGNOSIS — Z0000 Encounter for general adult medical examination without abnormal findings: Secondary | ICD-10-CM | POA: Diagnosis not present

## 2024-05-11 ENCOUNTER — Ambulatory Visit: Admitting: Nurse Practitioner

## 2024-05-12 ENCOUNTER — Other Ambulatory Visit (HOSPITAL_COMMUNITY): Payer: Self-pay

## 2024-05-12 ENCOUNTER — Ambulatory Visit (INDEPENDENT_AMBULATORY_CARE_PROVIDER_SITE_OTHER): Admitting: Nurse Practitioner

## 2024-05-12 ENCOUNTER — Encounter: Payer: Self-pay | Admitting: Nurse Practitioner

## 2024-05-12 VITALS — BP 118/78 | HR 75 | Ht 62.4 in | Wt 197.2 lb

## 2024-05-12 DIAGNOSIS — Z1331 Encounter for screening for depression: Secondary | ICD-10-CM

## 2024-05-12 DIAGNOSIS — Z79890 Hormone replacement therapy (postmenopausal): Secondary | ICD-10-CM | POA: Diagnosis not present

## 2024-05-12 DIAGNOSIS — Z01419 Encounter for gynecological examination (general) (routine) without abnormal findings: Secondary | ICD-10-CM

## 2024-05-12 MED ORDER — ESTRADIOL 0.05 MG/24HR TD PTTW
1.0000 | MEDICATED_PATCH | TRANSDERMAL | 3 refills | Status: AC
  Filled 2024-05-12 – 2024-07-12 (×3): qty 24, 84d supply, fill #0

## 2024-05-12 NOTE — Progress Notes (Signed)
 RASHEMA SEAWRIGHT 12-01-1975 [id number]   History:  48 y.o. G3 P3 presents for annual exam without GYN complaints. S/P 2012 [facility] for menorrhagia, on ERT with good management of hot flashes. Has tried to wean but does not tolerate. Normal pap history.  Gynecologic History Patient's last menstrual period was 07/18/2011.   Contraception: status post hysterectomy Sexually active: Yes  Health maintenance Last Pap: 04/05/2020. Results were: Normal neg HPV Last mammogram: 04/01/2024. Results were: Normal Last colonoscopy: 10/11/2021. Results were: SSP, 3-year recall Last Dexa: Not indicated     05/12/2024    9:59 AM  Depression screen PHQ 2/9  Decreased Interest 0  Down, Depressed, Hopeless 0  PHQ - 2 Score 0     Past medical history, past surgical history, family history and social history were all reviewed and documented in the EPIC chart. Married. 3 children ages 79-22. 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   ROS:  A ROS was performed and pertinent positives and negatives are included.  Exam:  Vitals:   05/12/24 0959  BP: 118/78  Pulse: 75  SpO2: 97%  Weight: 197 lb 3.2 oz (89.4 kg)  Height: 5' 2.4 (1.585 m)    Body mass index is 35.61 kg/m.  General appearance:  Normal Thyroid :  Symmetrical, normal in size, without palpable masses or nodularity. Respiratory  Auscultation:  Clear without wheezing or rhonchi Cardiovascular  Auscultation:  Regular rate, without rubs, murmurs or gallops  Edema/varicosities:  Not grossly evident Abdominal  Soft,nontender, without masses, guarding or rebound.  Liver/spleen:  No organomegaly noted  Hernia:  None appreciated  Skin  Inspection:  Grossly normal   Breasts: Examined lying and sitting.   Right: Without masses, retractions, discharge or axillary adenopathy.   Left: Without masses, retractions, discharge or axillary adenopathy. Pelvic: External genitalia:  no lesions              Urethra:  normal  appearing urethra with no masses, tenderness or lesions              Bartholins and Skenes: normal                 Vagina: normal appearing vagina with normal color and discharge, no lesions              Cervix: absent Bimanual Exam:  Uterus:  absent              Adnexa: no mass, fullness, tenderness              Rectovaginal: Deferred              Anus:  normal, no lesions  Elenor Mole, NP student performed exam with supervision.   Assessment/Plan:  48 y.o. G3 P3 for annual exam.   Well female exam with routine gynecological exam -  Education provided on SBEs, importance of preventative screenings, current guidelines, high calcium diet, regular exercise, and multivitamin daily. Labs with PCP.   Postmenopausal hormone therapy - Plan: estradiol  (VIVELLE -DOT) 0.05 MG/24HR patch twice weekly. Has not tolerated weaning in the past. She is aware of the benefits and risks to include benefits of symptom relief, bone health, and cardiovascular health. Risks including blood clots, heart attack, stroke, and breast cancer. She would like to continue. Refill x 1 year provided.   Screening for cervical cancer - Normal Pap history. No longer screening per guidelines.   Screening for breast cancer - Normal mammogram history.  Continue annual screenings.  Normal breast exam today.  Screening for colon cancer - 09/2021 colonoscopy. 3-year recall.   Screening for osteoporosis - Average risk. Will plan DXA further into menopause.   Return in about 1 year (around 05/12/2025) for Annual.    Annabella DELENA Shutter [facility], 10:10 AM 05/12/2024

## 2024-05-25 ENCOUNTER — Other Ambulatory Visit (HOSPITAL_COMMUNITY): Payer: Self-pay

## 2024-07-12 ENCOUNTER — Other Ambulatory Visit (HOSPITAL_COMMUNITY): Payer: Self-pay

## 2024-07-13 ENCOUNTER — Other Ambulatory Visit (HOSPITAL_COMMUNITY): Payer: Self-pay

## 2024-09-25 ENCOUNTER — Encounter: Payer: Self-pay | Admitting: Gastroenterology
